# Patient Record
Sex: Female | Born: 1950 | Race: Black or African American | Hispanic: No | Marital: Married | Smoking: Never smoker
Health system: Southern US, Community
[De-identification: ages and names within clinical notes are randomized; demographics above are authoritative.]

## PROBLEM LIST (undated history)

## (undated) DIAGNOSIS — Z972 Presence of dental prosthetic device (complete) (partial): Secondary | ICD-10-CM

## (undated) DIAGNOSIS — M1990 Unspecified osteoarthritis, unspecified site: Secondary | ICD-10-CM

## (undated) DIAGNOSIS — R002 Palpitations: Secondary | ICD-10-CM

## (undated) DIAGNOSIS — E119 Type 2 diabetes mellitus without complications: Secondary | ICD-10-CM

## (undated) DIAGNOSIS — M5431 Sciatica, right side: Secondary | ICD-10-CM

## (undated) DIAGNOSIS — L603 Nail dystrophy: Secondary | ICD-10-CM

## (undated) DIAGNOSIS — I10 Essential (primary) hypertension: Secondary | ICD-10-CM

## (undated) DIAGNOSIS — M722 Plantar fascial fibromatosis: Secondary | ICD-10-CM

## (undated) DIAGNOSIS — I38 Endocarditis, valve unspecified: Secondary | ICD-10-CM

## (undated) HISTORY — PX: CHOLECYSTECTOMY: SHX55

## (undated) HISTORY — PX: CATARACT EXTRACTION W/ INTRAOCULAR LENS IMPLANT: SHX1309

---

## 2005-12-20 ENCOUNTER — Emergency Department: Payer: Self-pay | Admitting: Emergency Medicine

## 2006-03-21 ENCOUNTER — Emergency Department: Payer: Self-pay | Admitting: Emergency Medicine

## 2006-05-26 ENCOUNTER — Emergency Department: Payer: Self-pay | Admitting: Emergency Medicine

## 2006-07-19 ENCOUNTER — Other Ambulatory Visit: Payer: Self-pay

## 2006-07-19 ENCOUNTER — Emergency Department: Payer: Self-pay | Admitting: General Practice

## 2007-05-09 ENCOUNTER — Ambulatory Visit: Payer: Self-pay | Admitting: Endocrinology

## 2007-06-13 ENCOUNTER — Ambulatory Visit: Payer: Self-pay | Admitting: General Surgery

## 2007-06-27 ENCOUNTER — Ambulatory Visit: Payer: Self-pay | Admitting: General Surgery

## 2007-07-03 ENCOUNTER — Ambulatory Visit: Payer: Self-pay | Admitting: General Surgery

## 2007-07-23 ENCOUNTER — Ambulatory Visit: Payer: Self-pay | Admitting: General Surgery

## 2007-07-23 ENCOUNTER — Other Ambulatory Visit: Payer: Self-pay

## 2007-07-30 ENCOUNTER — Ambulatory Visit: Payer: Self-pay | Admitting: General Surgery

## 2008-05-07 ENCOUNTER — Emergency Department: Payer: Self-pay | Admitting: Emergency Medicine

## 2008-06-17 ENCOUNTER — Ambulatory Visit: Payer: Self-pay | Admitting: Endocrinology

## 2009-06-22 ENCOUNTER — Ambulatory Visit: Payer: Self-pay | Admitting: Endocrinology

## 2011-06-01 ENCOUNTER — Ambulatory Visit: Payer: Self-pay | Admitting: Internal Medicine

## 2011-09-04 ENCOUNTER — Ambulatory Visit: Payer: Self-pay | Admitting: Internal Medicine

## 2012-06-11 ENCOUNTER — Ambulatory Visit: Payer: Self-pay | Admitting: Internal Medicine

## 2012-07-25 ENCOUNTER — Ambulatory Visit: Payer: Self-pay | Admitting: Internal Medicine

## 2014-04-25 ENCOUNTER — Emergency Department: Payer: Self-pay | Admitting: Emergency Medicine

## 2014-07-30 ENCOUNTER — Other Ambulatory Visit: Payer: Self-pay | Admitting: Internal Medicine

## 2014-07-30 DIAGNOSIS — Z1231 Encounter for screening mammogram for malignant neoplasm of breast: Secondary | ICD-10-CM

## 2014-08-19 ENCOUNTER — Ambulatory Visit
Admission: RE | Admit: 2014-08-19 | Discharge: 2014-08-19 | Disposition: A | Discharge status: Home / Self Care | Payer: 59 | Source: Ambulatory Visit | Attending: Internal Medicine | Admitting: Internal Medicine

## 2014-08-19 DIAGNOSIS — R922 Inconclusive mammogram: Secondary | ICD-10-CM | POA: Diagnosis not present

## 2014-08-19 DIAGNOSIS — Z1231 Encounter for screening mammogram for malignant neoplasm of breast: Secondary | ICD-10-CM | POA: Diagnosis not present

## 2014-08-24 ENCOUNTER — Ambulatory Visit: Payer: Self-pay | Admitting: Physical Therapy

## 2014-12-17 ENCOUNTER — Emergency Department: Payer: Worker's Compensation

## 2014-12-17 ENCOUNTER — Emergency Department
Admission: EM | Admit: 2014-12-17 | Discharge: 2014-12-17 | Disposition: A | Discharge status: Home / Self Care | Payer: Worker's Compensation | Attending: Emergency Medicine | Admitting: Emergency Medicine

## 2014-12-17 DIAGNOSIS — Z880 Allergy status to penicillin: Secondary | ICD-10-CM | POA: Diagnosis not present

## 2014-12-17 DIAGNOSIS — S0121XA Laceration without foreign body of nose, initial encounter: Secondary | ICD-10-CM | POA: Diagnosis not present

## 2014-12-17 DIAGNOSIS — S0990XA Unspecified injury of head, initial encounter: Secondary | ICD-10-CM

## 2014-12-17 DIAGNOSIS — E119 Type 2 diabetes mellitus without complications: Secondary | ICD-10-CM | POA: Diagnosis not present

## 2014-12-17 DIAGNOSIS — W010XXA Fall on same level from slipping, tripping and stumbling without subsequent striking against object, initial encounter: Secondary | ICD-10-CM | POA: Insufficient documentation

## 2014-12-17 DIAGNOSIS — Y9289 Other specified places as the place of occurrence of the external cause: Secondary | ICD-10-CM | POA: Diagnosis not present

## 2014-12-17 DIAGNOSIS — I10 Essential (primary) hypertension: Secondary | ICD-10-CM | POA: Diagnosis not present

## 2014-12-17 DIAGNOSIS — Y9389 Activity, other specified: Secondary | ICD-10-CM | POA: Insufficient documentation

## 2014-12-17 DIAGNOSIS — IMO0002 Reserved for concepts with insufficient information to code with codable children: Secondary | ICD-10-CM

## 2014-12-17 DIAGNOSIS — S0181XA Laceration without foreign body of other part of head, initial encounter: Secondary | ICD-10-CM | POA: Insufficient documentation

## 2014-12-17 DIAGNOSIS — S139XXA Sprain of joints and ligaments of unspecified parts of neck, initial encounter: Secondary | ICD-10-CM

## 2014-12-17 DIAGNOSIS — Y990 Civilian activity done for income or pay: Secondary | ICD-10-CM | POA: Insufficient documentation

## 2014-12-17 DIAGNOSIS — S134XXA Sprain of ligaments of cervical spine, initial encounter: Secondary | ICD-10-CM | POA: Diagnosis not present

## 2014-12-17 HISTORY — DX: Essential (primary) hypertension: I10

## 2014-12-17 HISTORY — DX: Type 2 diabetes mellitus without complications: E11.9

## 2014-12-17 LAB — GLUCOSE, CAPILLARY: GLUCOSE-CAPILLARY: 136 mg/dL — AB (ref 65–99)

## 2014-12-17 MED ORDER — NAPROXEN 500 MG PO TABS
500.0000 mg | ORAL_TABLET | Freq: Once | ORAL | Status: AC
  Administered 2014-12-17: 500 mg via ORAL
  Filled 2014-12-17: qty 1

## 2014-12-17 MED ORDER — LIDOCAINE-EPINEPHRINE (PF) 1 %-1:200000 IJ SOLN
30.0000 mL | Freq: Once | INTRAMUSCULAR | Status: AC
  Administered 2014-12-17: 30 mL
  Filled 2014-12-17: qty 30

## 2014-12-17 MED ORDER — BACITRACIN ZINC 500 UNIT/GM EX OINT
TOPICAL_OINTMENT | CUTANEOUS | Status: DC
Start: 2014-12-17 — End: 2014-12-17
  Filled 2014-12-17: qty 0.9

## 2014-12-17 MED ORDER — BACITRACIN ZINC 500 UNIT/GM EX OINT
TOPICAL_OINTMENT | Freq: Two times a day (BID) | CUTANEOUS | Status: DC
  Administered 2014-12-17: 1 via TOPICAL

## 2014-12-17 MED ORDER — ONDANSETRON HCL 4 MG PO TABS: 4.0000 mg | ORAL_TABLET | Freq: Three times a day (TID) | ORAL | Status: AC | PRN

## 2014-12-17 MED ORDER — HYDROCODONE-ACETAMINOPHEN 5-325 MG PO TABS: 1.0000 | ORAL_TABLET | ORAL | Status: DC | PRN

## 2014-12-17 MED ORDER — NAPROXEN 500 MG PO TABS: 500.0000 mg | ORAL_TABLET | Freq: Two times a day (BID) | ORAL | Status: AC

## 2014-12-17 MED ORDER — HYDROCODONE-ACETAMINOPHEN 5-325 MG PO TABS
1.0000 | ORAL_TABLET | Freq: Once | ORAL | Status: AC
  Administered 2014-12-17: 1 via ORAL
  Filled 2014-12-17: qty 1

## 2014-12-17 MED ORDER — ONDANSETRON 4 MG PO TBDP
4.0000 mg | ORAL_TABLET | Freq: Once | ORAL | Status: AC
  Administered 2014-12-17: 4 mg via ORAL
  Filled 2014-12-17: qty 1

## 2014-12-17 NOTE — Discharge Instructions (Signed)
Cervical Strain and Sprain (Whiplash) °with Rehab °Cervical strain and sprain are injuries that commonly occur with "whiplash" injuries. Whiplash occurs when the neck is forcefully whipped backward or forward, such as during a motor vehicle accident or during contact sports. The muscles, ligaments, tendons, discs, and nerves of the neck are susceptible to injury when this occurs. °RISK FACTORS °Risk of having a whiplash injury increases if: °· Osteoarthritis of the spine. °· Situations that make head or neck accidents or trauma more likely. °· High-risk sports (football, rugby, wrestling, hockey, auto racing, gymnastics, diving, contact karate, or boxing). °· Poor strength and flexibility of the neck. °· Previous neck injury. °· Poor tackling technique. °· Improperly fitted or padded equipment. °SYMPTOMS  °· Pain or stiffness in the front or back of neck or both. °· Symptoms may present immediately or up to 24 hours after injury. °· Dizziness, headache, nausea, and vomiting. °· Muscle spasm with soreness and stiffness in the neck. °· Tenderness and swelling at the injury site. °PREVENTION °· Learn and use proper technique (avoid tackling with the head, spearing, and head-butting; use proper falling techniques to avoid landing on the head). °· Warm up and stretch properly before activity. °· Maintain physical fitness: °· Strength, flexibility, and endurance. °· Cardiovascular fitness. °· Wear properly fitted and padded protective equipment, such as padded soft collars, for participation in contact sports. °PROGNOSIS  °Recovery from cervical strain and sprain injuries is dependent on the extent of the injury. These injuries are usually curable in 1 week to 3 months with appropriate treatment.  °RELATED COMPLICATIONS  °· Temporary numbness and weakness may occur if the nerve roots are damaged, and this may persist until the nerve has completely healed. °· Chronic pain due to frequent recurrence of  symptoms. °· Prolonged healing, especially if activity is resumed too soon (before complete recovery). °TREATMENT  °Treatment initially involves the use of ice and medication to help reduce pain and inflammation. It is also important to perform strengthening and stretching exercises and modify activities that worsen symptoms so the injury does not get worse. These exercises may be performed at home or with a therapist. For patients who experience severe symptoms, a soft, padded collar may be recommended to be worn around the neck.  °Improving your posture may help reduce symptoms. Posture improvement includes pulling your chin and abdomen in while sitting or standing. If you are sitting, sit in a firm chair with your buttocks against the back of the chair. While sleeping, try replacing your pillow with a small towel rolled to 2 inches in diameter, or use a cervical pillow or soft cervical collar. Poor sleeping positions delay healing.  °For patients with nerve root damage, which causes numbness or weakness, the use of a cervical traction apparatus may be recommended. Surgery is rarely necessary for these injuries. However, cervical strain and sprains that are present at birth (congenital) may require surgery. °MEDICATION  °· If pain medication is necessary, nonsteroidal anti-inflammatory medications, such as aspirin and ibuprofen, or other minor pain relievers, such as acetaminophen, are often recommended. °· Do not take pain medication for 7 days before surgery. °· Prescription pain relievers may be given if deemed necessary by your caregiver. Use only as directed and only as much as you need. °HEAT AND COLD:  °· Cold treatment (icing) relieves pain and reduces inflammation. Cold treatment should be applied for 10 to 15 minutes every 2 to 3 hours for inflammation and pain and immediately after any activity that aggravates   your symptoms. Use ice packs or an ice massage. °· Heat treatment may be used prior to  performing the stretching and strengthening activities prescribed by your caregiver, physical therapist, or athletic trainer. Use a heat pack or a warm soak. °SEEK MEDICAL CARE IF:  °· Symptoms get worse or do not improve in 2 weeks despite treatment. °· New, unexplained symptoms develop (drugs used in treatment may produce side effects). °EXERCISES °RANGE OF MOTION (ROM) AND STRETCHING EXERCISES - Cervical Strain and Sprain °These exercises may help you when beginning to rehabilitate your injury. In order to successfully resolve your symptoms, you must improve your posture. These exercises are designed to help reduce the forward-head and rounded-shoulder posture which contributes to this condition. Your symptoms may resolve with or without further involvement from your physician, physical therapist or athletic trainer. While completing these exercises, remember:  °· Restoring tissue flexibility helps normal motion to return to the joints. This allows healthier, less painful movement and activity. °· An effective stretch should be held for at least 20 seconds, although you may need to begin with shorter hold times for comfort. °· A stretch should never be painful. You should only feel a gentle lengthening or release in the stretched tissue. °STRETCH- Axial Extensors °· Lie on your back on the floor. You may bend your knees for comfort. Place a rolled-up hand towel or dish towel, about 2 inches in diameter, under the part of your head that makes contact with the floor. °· Gently tuck your chin, as if trying to make a "double chin," until you feel a gentle stretch at the base of your head. °· Hold __________ seconds. °Repeat __________ times. Complete this exercise __________ times per day.  °STRETCH - Axial Extension  °· Stand or sit on a firm surface. Assume a good posture: chest up, shoulders drawn back, abdominal muscles slightly tense, knees unlocked (if standing) and feet hip width apart. °· Slowly retract your  chin so your head slides back and your chin slightly lowers. Continue to look straight ahead. °· You should feel a gentle stretch in the back of your head. Be certain not to feel an aggressive stretch since this can cause headaches later. °· Hold for __________ seconds. °Repeat __________ times. Complete this exercise __________ times per day. °STRETCH - Cervical Side Bend  °· Stand or sit on a firm surface. Assume a good posture: chest up, shoulders drawn back, abdominal muscles slightly tense, knees unlocked (if standing) and feet hip width apart. °· Without letting your nose or shoulders move, slowly tip your right / left ear to your shoulder until your feel a gentle stretch in the muscles on the opposite side of your neck. °· Hold __________ seconds. °Repeat __________ times. Complete this exercise __________ times per day. °STRETCH - Cervical Rotators  °· Stand or sit on a firm surface. Assume a good posture: chest up, shoulders drawn back, abdominal muscles slightly tense, knees unlocked (if standing) and feet hip width apart. °· Keeping your eyes level with the ground, slowly turn your head until you feel a gentle stretch along the back and opposite side of your neck. °· Hold __________ seconds. °Repeat __________ times. Complete this exercise __________ times per day. °RANGE OF MOTION - Neck Circles  °· Stand or sit on a firm surface. Assume a good posture: chest up, shoulders drawn back, abdominal muscles slightly tense, knees unlocked (if standing) and feet hip width apart. °· Gently roll your head down and around from the   back of one shoulder to the back of the other. The motion should never be forced or painful.  Repeat the motion 10-20 times, or until you feel the neck muscles relax and loosen. Repeat __________ times. Complete the exercise __________ times per day. STRENGTHENING EXERCISES - Cervical Strain and Sprain These exercises may help you when beginning to rehabilitate your injury. They may  resolve your symptoms with or without further involvement from your physician, physical therapist, or athletic trainer. While completing these exercises, remember:   Muscles can gain both the endurance and the strength needed for everyday activities through controlled exercises.  Complete these exercises as instructed by your physician, physical therapist, or athletic trainer. Progress the resistance and repetitions only as guided.  You may experience muscle soreness or fatigue, but the pain or discomfort you are trying to eliminate should never worsen during these exercises. If this pain does worsen, stop and make certain you are following the directions exactly. If the pain is still present after adjustments, discontinue the exercise until you can discuss the trouble with your clinician. STRENGTH - Cervical Flexors, Isometric  Face a wall, standing about 6 inches away. Place a small pillow, a ball about 6-8 inches in diameter, or a folded towel between your forehead and the wall.  Slightly tuck your chin and gently push your forehead into the soft object. Push only with mild to moderate intensity, building up tension gradually. Keep your jaw and forehead relaxed.  Hold 10 to 20 seconds. Keep your breathing relaxed.  Release the tension slowly. Relax your neck muscles completely before you start the next repetition. Repeat __________ times. Complete this exercise __________ times per day. STRENGTH- Cervical Lateral Flexors, Isometric   Stand about 6 inches away from a wall. Place a small pillow, a ball about 6-8 inches in diameter, or a folded towel between the side of your head and the wall.  Slightly tuck your chin and gently tilt your head into the soft object. Push only with mild to moderate intensity, building up tension gradually. Keep your jaw and forehead relaxed.  Hold 10 to 20 seconds. Keep your breathing relaxed.  Release the tension slowly. Relax your neck muscles completely  before you start the next repetition. Repeat __________ times. Complete this exercise __________ times per day. STRENGTH - Cervical Extensors, Isometric   Stand about 6 inches away from a wall. Place a small pillow, a ball about 6-8 inches in diameter, or a folded towel between the back of your head and the wall.  Slightly tuck your chin and gently tilt your head back into the soft object. Push only with mild to moderate intensity, building up tension gradually. Keep your jaw and forehead relaxed.  Hold 10 to 20 seconds. Keep your breathing relaxed.  Release the tension slowly. Relax your neck muscles completely before you start the next repetition. Repeat __________ times. Complete this exercise __________ times per day. POSTURE AND BODY MECHANICS CONSIDERATIONS - Cervical Strain and Sprain Keeping correct posture when sitting, standing or completing your activities will reduce the stress put on different body tissues, allowing injured tissues a chance to heal and limiting painful experiences. The following are general guidelines for improved posture. Your physician or physical therapist will provide you with any instructions specific to your needs. While reading these guidelines, remember:  The exercises prescribed by your provider will help you have the flexibility and strength to maintain correct postures.  The correct posture provides the optimal environment for your joints to  work. All of your joints have less wear and tear when properly supported by a spine with good posture. This means you will experience a healthier, less painful body.  Correct posture must be practiced with all of your activities, especially prolonged sitting and standing. Correct posture is as important when doing repetitive low-stress activities (typing) as it is when doing a single heavy-load activity (lifting). PROLONGED STANDING WHILE SLIGHTLY LEANING FORWARD When completing a task that requires you to lean  forward while standing in one place for a long time, place either foot up on a stationary 2- to 4-inch high object to help maintain the best posture. When both feet are on the ground, the low back tends to lose its slight inward curve. If this curve flattens (or becomes too large), then the back and your other joints will experience too much stress, fatigue more quickly, and can cause pain.  RESTING POSITIONS Consider which positions are most painful for you when choosing a resting position. If you have pain with flexion-based activities (sitting, bending, stooping, squatting), choose a position that allows you to rest in a less flexed posture. You would want to avoid curling into a fetal position on your side. If your pain worsens with extension-based activities (prolonged standing, working overhead), avoid resting in an extended position such as sleeping on your stomach. Most people will find more comfort when they rest with their spine in a more neutral position, neither too rounded nor too arched. Lying on a non-sagging bed on your side with a pillow between your knees, or on your back with a pillow under your knees will often provide some relief. Keep in mind, being in any one position for a prolonged period of time, no matter how correct your posture, can still lead to stiffness. WALKING Walk with an upright posture. Your ears, shoulders, and hips should all line up. OFFICE WORK When working at a desk, create an environment that supports good, upright posture. Without extra support, muscles fatigue and lead to excessive strain on joints and other tissues. CHAIR:  A chair should be able to slide under your desk when your back makes contact with the back of the chair. This allows you to work closely.  The chair's height should allow your eyes to be level with the upper part of your monitor and your hands to be slightly lower than your elbows.  Body position:  Your feet should make contact with the  floor. If this is not possible, use a foot rest.  Keep your ears over your shoulders. This will reduce stress on your neck and low back. Document Released: 03/06/2005 Document Revised: 07/21/2013 Document Reviewed: 06/18/2008 [facility] Patient Information 2015 [place], [place]. This information is not intended to replace advice given to you by your health care provider. Make sure you discuss any questions you have with your health care provider.  Concussion A concussion is a brain injury. It is caused by:  A hit to the head.  A quick and sudden movement (jolt) of the head or neck. A concussion is usually not life threatening. Even so, it can cause serious problems. If you had a concussion before, you may have concussion-like problems after a hit to your head. HOME CARE General Instructions  Follow your doctor's directions carefully.  Take medicines only as told by your doctor.  Only take medicines your doctor says are safe.  Do not drink alcohol until your doctor says it is okay. Alcohol and some drugs can slow down  healing. They can also put you at risk for further injury.  If you are having trouble remembering things, write them down.  Try to do one thing at a time if you get distracted easily. For example, do not watch TV while making dinner.  Talk to your family members or close friends when making important decisions.  Follow up with your doctor as told.  Watch your symptoms. Tell others to do the same. Serious problems can sometimes happen after a concussion. Older adults are more likely to have these problems.  Tell your teachers, school nurse, school counselor, coach, Event organiser, or work Production designer, theatre/television/film about your concussion. Tell them about what you can or cannot do. They should watch to see if:  It gets even harder for you to pay attention or concentrate.  It gets even harder for you to remember things or learn new things.  You need more time than normal to finish  things.  You become annoyed (irritable) more than before.  You are not able to deal with stress as well.  You have more problems than before.  Rest. Make sure you:  Get plenty of sleep at night.  Go to sleep early.  Go to bed at the same time every day. Try to wake up at the same time.  Rest during the day.  Take naps when you feel tired.  Limit activities where you have to think a lot or concentrate. These include:  Doing homework.  Doing work related to a job.  Watching TV.  Using the computer. Returning To Your Regular Activities Return to your normal activities slowly, not all at once. You must give your body and brain enough time to heal.   Do not play sports or do other athletic activities until your doctor says it is okay.  Ask your doctor when you can drive, ride a bicycle, or work other vehicles or machines. Never do these things if you feel dizzy.  Ask your doctor about when you can return to work or school. Preventing Another Concussion It is very important to avoid another brain injury, especially before you have healed. In rare cases, another injury can lead to permanent brain damage, brain swelling, or death. The risk of this is greatest during the first 7-10 days after your injury. Avoid injuries by:   Wearing a seat belt when riding in a car.  Not drinking too much alcohol.  Avoiding activities that could lead to a second concussion (such as contact sports).  Wearing a helmet when doing activities like:  Biking.  Skiing.  Skateboarding.  Skating.  Making your home safer by:  Removing things from the floor or stairways that could make you trip.  Using grab bars in bathrooms and handrails by stairs.  Placing non-slip mats on floors and in bathtubs.  Improve lighting in dark areas. GET HELP IF:  It gets even harder for you to pay attention or concentrate.  It gets even harder for you to remember things or learn new things.  You need  more time than normal to finish things.  You become annoyed (irritable) more than before.  You are not able to deal with stress as well.  You have more problems than before.  You have problems keeping your balance.  You are not able to react quickly when you should. Get help if you have any of these problems for more than 2 weeks:   Lasting (chronic) headaches.  Dizziness or trouble balancing.  Feeling sick to  your stomach (nausea).  Seeing (vision) problems.  Being affected by noises or light more than normal.  Feeling sad, low, down in the dumps, blue, gloomy, or empty (depressed).  Mood changes (mood swings).  Feeling of fear or nervousness about what may happen (anxiety).  Feeling annoyed.  Memory problems.  Problems concentrating or paying attention.  Sleep problems.  Feeling tired all the time. GET HELP RIGHT AWAY IF:   You have bad headaches or your headaches get worse.  You have weakness (even if it is in one hand, leg, or part of the face).  You have loss of feeling (numbness).  You feel off balance.  You keep throwing up (vomiting).  You feel tired.  One black center of your eye (pupil) is larger than the other.  You twitch or shake violently (convulse).  Your speech is not clear (slurred).  You are more confused, easily angered (agitated), or annoyed than before.  You have more trouble resting than before.  You are unable to recognize people or places.  You have neck pain.  It is difficult to wake you up.  You have unusual behavior changes.  You pass out (lose consciousness). MAKE SURE YOU:   Understand these instructions.  Will watch your condition.  Will get help right away if you are not doing well or get worse. Document Released: 02/22/2009 Document Revised: 07/21/2013 Document Reviewed: 09/26/2012 [facility] Patient Information 2015 [place], [place]. This information is not intended to replace advice given to you by your  health care provider. Make sure you discuss any questions you have with your health care provider.  Laceration Care, Adult A laceration is a cut that goes through all layers of the skin. The cut goes into the tissue beneath the skin. HOME CARE For stitches (sutures) or staples:  Keep the cut clean and dry.  If you have a bandage (dressing), change it at least once a day. Change the bandage if it gets wet or dirty, or as told by your doctor.  Wash the cut with soap and water 2 times a day. Rinse the cut with water. Pat it dry with a clean towel.  Put a thin layer of medicated cream on the cut as told by your doctor.  You may shower after the first 24 hours. Do not soak the cut in water until the stitches are removed.  Only take medicines as told by your doctor.  Have your stitches or staples removed as told by your doctor. For skin adhesive strips:  Keep the cut clean and dry.  Do not get the strips wet. You may take a bath, but be careful to keep the cut dry.  If the cut gets wet, pat it dry with a clean towel.  The strips will fall off on their own. Do not remove the strips that are still stuck to the cut. For wound glue:  You may shower or take baths. Do not soak or scrub the cut. Do not swim. Avoid heavy sweating until the glue falls off on its own. After a shower or bath, pat the cut dry with a clean towel.  Do not put medicine on your cut until the glue falls off.  If you have a bandage, do not put tape over the glue.  Avoid lots of sunlight or tanning lamps until the glue falls off. Put sunscreen on the cut for the first year to reduce your scar.  The glue will fall off on its own. Do not pick  at the glue. You may need a tetanus shot if:  You cannot remember when you had your last tetanus shot.  You have never had a tetanus shot. If you need a tetanus shot and you choose not to have one, you may get tetanus. Sickness from tetanus can be serious. GET HELP RIGHT AWAY  IF:   Your pain does not get better with medicine.  Your arm, hand, leg, or foot loses feeling (numbness) or changes color.  Your cut is bleeding.  Your joint feels weak, or you cannot use your joint.  You have painful lumps on your body.  Your cut is red, puffy (swollen), or painful.  You have a red line on the skin near the cut.  You have yellowish-white fluid (pus) coming from the cut.  You have a fever.  You have a bad smell coming from the cut or bandage.  Your cut breaks open before or after stitches are removed.  You notice something coming out of the cut, such as wood or glass.  You cannot move a finger or toe. MAKE SURE YOU:   Understand these instructions.  Will watch your condition.  Will get help right away if you are not doing well or get worse. Document Released: 08/23/2007 Document Revised: 05/29/2011 Document Reviewed: 08/30/2010 [facility] Patient Information 2015 [place], [place]. This information is not intended to replace advice given to you by your health care provider. Make sure you discuss any questions you have with your health care provider.   Pain medicine as directed. Watch for signs of infection. Follow up in 5-7 days for suture removal. Return sooner for any concerns.

## 2014-12-17 NOTE — ED Notes (Signed)
Brought in via co-worker  Fell laceration noted to forehead,left wrist and both knees

## 2014-12-17 NOTE — ED Notes (Signed)
Urine drug screen was performed by Paramedic Sydnee Cabal

## 2014-12-17 NOTE — ED Provider Notes (Signed)
[facility] Emergency Department Provider Note  ____________________________________________  Time seen: Approximately 4:05 PM  I have reviewed the triage vital signs and the nursing notes.   HISTORY  Chief Complaint Fall    HPI Brenda Mullen is a 64 y.o. female with history of hypertension and diabetes who tripped while at work today, falling forward and hitting her forehead.   Her glassescaused a laceration to the forehead and bridge of the nose.  Has dizziness with movement as well as nausea.   Also having neck pain with movement, and left  Wrist pain.  She fell onto her knees as well and is having bilateral knee pain.   Past Medical History  Diagnosis Date  . Diabetes mellitus without complication   . Hypertension     There are no active problems to display for this patient.   History reviewed. No pertinent past surgical history.  Current Outpatient Rx  Name  Route  Sig  Dispense  Refill  . HYDROcodone-acetaminophen (NORCO) 5-325 MG tablet   Oral   Take 1 tablet by mouth every 4 (four) hours as needed for moderate pain.   20 tablet   0   . naproxen (NAPROSYN) 500 MG tablet   Oral   Take 1 tablet (500 mg total) by mouth 2 (two) times daily with a meal.   40 tablet   0   . ondansetron (ZOFRAN) 4 MG tablet   Oral   Take 1 tablet (4 mg total) by mouth every 8 (eight) hours as needed for nausea or vomiting.   12 tablet   0     Allergies Metformin and related and Penicillins  Family History  Problem Relation Age of Onset  . Cancer Sister     uterine    Social History Social History  Substance Use Topics  . Smoking status: Never Smoker   . Smokeless tobacco: None  . Alcohol Use: No    Review of Systems Constitutional: No fever/chills Eyes: No visual changes. ENT: No sore throat. Cardiovascular: Denies chest pain. Respiratory: Denies shortness of breath. Gastrointestinal: No abdominal pain.  Nausea, but no vomiting.  No  diarrhea.  No constipation. Genitourinary: Negative for dysuria. Musculoskeletal: as above Skin: Negative for rash. Neurological: as above  10-point ROS otherwise negative.  ____________________________________________   PHYSICAL EXAM:  VITAL SIGNS: ED Triage Vitals  Enc Vitals Group     BP --      Pulse --      Resp --      Temp --      Temp src --      SpO2 --      Weight 12/17/14 1406 182 lb (82.555 kg)     Height 12/17/14 1406  (1.6 m)     Head Cir --      Peak Flow --      Pain Score 12/17/14 1513 8     Pain Loc --      Pain Edu? --      Excl. in GC? --     Constitutional: Alert and oriented. Well appearing and in no acute distress. Eyes: Conjunctivae are normal. PERRL. EOMI. Head: Atraumatic. Nose: No congestion/rhinnorhea. Neuro:  cranial nerves II through XII grossly intact. Normal cerebellar function. finger to nose testing. Mouth/Throat: Mucous membranes are moist.  Oropharynx non-erythematous. Neck: No stridor.   cervical spine tenderness to palpation. Hematological/Lymphatic/Immunilogical: No cervical lymphadenopathy. Cardiovascular: Normal rate, regular rhythm. Grossly normal heart sounds.  Good peripheral circulation.  Respiratory: Normal respiratory effort.  No retractions. Lungs CTAB. Gastrointestinal: Soft and nontender. No distention. No abdominal bruits. No CVA tenderness. Musculoskeletal: No lower extremity tenderness nor edema.  No joint effusions. Left knee: Over the suprapateller region.  No Effusion. range of motion intact, no laxity with valgus or varus stress or lachmans test. Left wrist;  No snuff box tenderness.  rom intact.  Tender over the volar wrist.  2+ radial pulse Neurologic:  Normal speech and language. No gross focal neurologic deficits are appreciated. No gait instability. Skin:  Skin is warm, dry and intact. No rash noted. Psychiatric: Mood and affect are normal. Speech and behavior are  normal.  ____________________________________________   LABS (all labs ordered are listed, but only abnormal results are displayed)  Labs Reviewed  GLUCOSE, CAPILLARY - Abnormal; Notable for the following:    Glucose-Capillary 136 (*)    All other components within normal limits   ____________________________________________  EKG   ____________________________________________  RADIOLOGY  CLINICAL DATA: Fall. Wrist pain.  EXAM: LEFT WRIST - COMPLETE 3+ VIEW  COMPARISON: None.  FINDINGS: Normal alignment. Negative for fracture. No significant arthropathy.  IMPRESSION: Negative.   Electronically Signed  By: Marlan Palau M.D.  On: 12/17/2014 16:10   FINDINGS: CT HEAD FINDINGS  Ventricle size normal. Cerebral volume normal. Negative for acute infarct. Negative for hemorrhage or mass  Scalp laceration anteriorly with gas in the soft tissues. Negative for skull fracture.  CT CERVICAL SPINE FINDINGS  Cervical kyphosis. Normal alignment. Disc degeneration and spurring C4-5, C5-6, C6-7. Mild facet degeneration on the left at these levels.  Negative for fracture.  IMPRESSION: No acute intracranial abnormality. Frontal laceration.  Cervical kyphosis and degenerative change. Negative for fracture.   Electronically Signed  By: Marlan Palau M.D.  On: 12/17/2014 16:04 ____________________________________________   PROCEDURES  Procedure(s) performed: LACERATION REPAIR Performed by: Ignacia Bayley Authorized by: Ignacia Bayley Consent: Verbal consent obtained. Risks and benefits: risks, benefits and alternatives were discussed Consent given by: patient Patient identity confirmed: provided demographic data Prepped and Draped in normal sterile fashion Wound explored  Laceration Location: nose, bridge  Laceration Length: 1cm  No Foreign Bodies seen or palpated  Anesthesia: local infiltration  Local anesthetic: lidocaine 1%  with epinephrine  Anesthetic total: 3 ml  Irrigation method: syringe Amount of cleaning: standard  Skin closure: 5-0 nylon  Number of sutures: 3  Technique: simple interrupted  Patient tolerance: Patient tolerated the procedure well with no immediate complications.    LACERATION REPAIR Performed by: Ignacia Bayley Authorized by: Ignacia Bayley Consent: Verbal consent obtained. Risks and benefits: risks, benefits and alternatives were discussed Consent given by: patient Patient identity confirmed: provided demographic data Prepped and Draped in normal sterile fashion Wound explored  Laceration Location: forehead  Laceration Length: 1.5cm  No Foreign Bodies seen or palpated  Anesthesia: local infiltration  Local anesthetic: none  Anesthetic total: 0 ml  Irrigation method: syringe Amount of cleaning: standard  Skin closure: adhesive  Number of sutures: na  Technique: skin adhesive  Patient tolerance: Patient tolerated the procedure well with no immediate complications.   Critical Care performed: No  ____________________________________________   INITIAL IMPRESSION / ASSESSMENT AND PLAN / ED COURSE  Pertinent labs & imaging results that were available during my care of the patient were reviewed by me and considered in my medical decision making (see chart for details).  64 year old female who presents with head injury from a fall.  Suffered  Two lacerations, repaired as above.   Neg head  CT/Cervical spine.  Instructed in wound care and returning in 5-7 days for suture removal.  ____________________________________________   FINAL CLINICAL IMPRESSION(S) / ED DIAGNOSES  Final diagnoses:  Head injury, initial encounter  Laceration  Cervical sprain, initial encounter      Ignacia Bayley, PA-C 12/17/14 1855  Phineas Semen, MD 12/17/14 2103

## 2014-12-17 NOTE — ED Notes (Signed)
Patient comes in for fall at work at [employer]. Patient tripped over box, fell and hit forehead and nose sustaining two lacerations.  One laceration to forehead and one to nose.  Nose laceration bleeding, guaze applied.  Pt reports wearing glasses and that is what she feels called laceration.  Patient denies loss of consciousness but is complaining of dizziness.

## 2015-09-30 ENCOUNTER — Other Ambulatory Visit: Payer: Self-pay | Admitting: Internal Medicine

## 2015-09-30 DIAGNOSIS — Z1231 Encounter for screening mammogram for malignant neoplasm of breast: Secondary | ICD-10-CM

## 2015-11-04 ENCOUNTER — Other Ambulatory Visit: Payer: Self-pay | Admitting: Internal Medicine

## 2015-11-04 ENCOUNTER — Ambulatory Visit
Admission: RE | Admit: 2015-11-04 | Discharge: 2015-11-04 | Disposition: A | Discharge status: Home / Self Care | Payer: 59 | Source: Ambulatory Visit | Attending: Internal Medicine | Admitting: Internal Medicine

## 2015-11-04 DIAGNOSIS — Z1231 Encounter for screening mammogram for malignant neoplasm of breast: Secondary | ICD-10-CM

## 2016-08-31 ENCOUNTER — Other Ambulatory Visit: Payer: Self-pay | Admitting: Internal Medicine

## 2016-08-31 DIAGNOSIS — Z1231 Encounter for screening mammogram for malignant neoplasm of breast: Secondary | ICD-10-CM

## 2016-11-06 ENCOUNTER — Ambulatory Visit
Admission: RE | Admit: 2016-11-06 | Discharge: 2016-11-06 | Disposition: A | Discharge status: Home / Self Care | Payer: 59 | Source: Ambulatory Visit | Attending: Internal Medicine | Admitting: Internal Medicine

## 2016-11-06 DIAGNOSIS — R928 Other abnormal and inconclusive findings on diagnostic imaging of breast: Secondary | ICD-10-CM | POA: Diagnosis not present

## 2016-11-06 DIAGNOSIS — Z1231 Encounter for screening mammogram for malignant neoplasm of breast: Secondary | ICD-10-CM

## 2016-11-09 ENCOUNTER — Other Ambulatory Visit: Payer: Self-pay | Admitting: Internal Medicine

## 2016-11-09 DIAGNOSIS — R928 Other abnormal and inconclusive findings on diagnostic imaging of breast: Secondary | ICD-10-CM

## 2016-11-09 DIAGNOSIS — N6489 Other specified disorders of breast: Secondary | ICD-10-CM

## 2016-11-23 ENCOUNTER — Ambulatory Visit
Admission: RE | Admit: 2016-11-23 | Discharge: 2016-11-23 | Disposition: A | Discharge status: Home / Self Care | Payer: 59 | Source: Ambulatory Visit | Attending: Internal Medicine | Admitting: Internal Medicine

## 2016-11-23 DIAGNOSIS — N6489 Other specified disorders of breast: Secondary | ICD-10-CM

## 2016-11-23 DIAGNOSIS — R928 Other abnormal and inconclusive findings on diagnostic imaging of breast: Secondary | ICD-10-CM

## 2017-01-11 ENCOUNTER — Other Ambulatory Visit: Payer: Self-pay | Admitting: Internal Medicine

## 2017-01-11 DIAGNOSIS — R748 Abnormal levels of other serum enzymes: Secondary | ICD-10-CM

## 2017-01-19 ENCOUNTER — Ambulatory Visit
Admission: RE | Admit: 2017-01-19 | Discharge: 2017-01-19 | Disposition: A | Discharge status: Home / Self Care | Payer: 59 | Source: Ambulatory Visit | Attending: Internal Medicine | Admitting: Internal Medicine

## 2017-01-19 DIAGNOSIS — K760 Fatty (change of) liver, not elsewhere classified: Secondary | ICD-10-CM | POA: Diagnosis not present

## 2017-01-19 DIAGNOSIS — Z9049 Acquired absence of other specified parts of digestive tract: Secondary | ICD-10-CM | POA: Insufficient documentation

## 2017-01-19 DIAGNOSIS — R748 Abnormal levels of other serum enzymes: Secondary | ICD-10-CM | POA: Diagnosis not present

## 2017-08-17 ENCOUNTER — Other Ambulatory Visit: Payer: Self-pay | Admitting: Student

## 2017-08-17 DIAGNOSIS — M4726 Other spondylosis with radiculopathy, lumbar region: Secondary | ICD-10-CM

## 2017-08-22 ENCOUNTER — Ambulatory Visit
Admission: RE | Admit: 2017-08-22 | Discharge: 2017-08-22 | Disposition: A | Discharge status: Home / Self Care | Payer: Medicare Other | Source: Ambulatory Visit | Attending: Student | Admitting: Student

## 2017-08-22 DIAGNOSIS — M4726 Other spondylosis with radiculopathy, lumbar region: Secondary | ICD-10-CM | POA: Diagnosis present

## 2017-08-22 DIAGNOSIS — M5136 Other intervertebral disc degeneration, lumbar region: Secondary | ICD-10-CM | POA: Insufficient documentation

## 2017-08-22 DIAGNOSIS — M48061 Spinal stenosis, lumbar region without neurogenic claudication: Secondary | ICD-10-CM | POA: Diagnosis not present

## 2017-08-27 ENCOUNTER — Other Ambulatory Visit: Payer: Self-pay | Admitting: Student

## 2017-08-27 DIAGNOSIS — M4726 Other spondylosis with radiculopathy, lumbar region: Secondary | ICD-10-CM

## 2017-09-06 ENCOUNTER — Ambulatory Visit: Admission: RE | Admit: 2017-09-06 | Payer: Medicare Other | Source: Ambulatory Visit

## 2017-09-06 ENCOUNTER — Ambulatory Visit
Admission: RE | Admit: 2017-09-06 | Discharge: 2017-09-06 | Disposition: A | Discharge status: Home / Self Care | Payer: Medicare Other | Source: Ambulatory Visit | Attending: Student | Admitting: Student

## 2017-09-06 ENCOUNTER — Encounter: Payer: Self-pay | Admitting: Interventional Radiology

## 2017-09-06 DIAGNOSIS — M47817 Spondylosis without myelopathy or radiculopathy, lumbosacral region: Secondary | ICD-10-CM | POA: Diagnosis not present

## 2017-09-06 DIAGNOSIS — M4726 Other spondylosis with radiculopathy, lumbar region: Secondary | ICD-10-CM

## 2017-09-06 HISTORY — PX: IR FLUORO GUIDED NEEDLE PLC ASPIRATION/INJECTION LOC: IMG2395

## 2017-09-06 LAB — GLUCOSE, CAPILLARY: Glucose-Capillary: 134 mg/dL — ABNORMAL HIGH (ref 65–99)

## 2017-09-06 MED ORDER — LIDOCAINE HCL (PF) 1 % IJ SOLN
INTRAMUSCULAR | Status: AC
  Filled 2017-09-06: qty 30

## 2017-09-06 MED ORDER — HYDROCODONE-ACETAMINOPHEN 5-325 MG PO TABS: 1.0000 | ORAL_TABLET | ORAL | Status: DC | PRN

## 2017-09-06 MED ORDER — METHYLPREDNISOLONE ACETATE 80 MG/ML IJ SUSP
INTRAMUSCULAR | Status: AC
Start: 2017-09-06 — End: ?
  Filled 2017-09-06: qty 2

## 2017-09-06 MED ORDER — ONDANSETRON HCL 4 MG/2ML IJ SOLN: 4.0000 mg | Freq: Four times a day (QID) | INTRAMUSCULAR | Status: DC | PRN

## 2017-09-06 MED ORDER — IOPAMIDOL (ISOVUE-300) INJECTION 61%: 30.0000 mL | Freq: Once | INTRAVENOUS | Status: DC | PRN

## 2017-09-06 NOTE — Procedures (Signed)
  Procedure: R L5-S1 facet injection   EBL:   minimal Complications:  none immediate  See full dictation in [facility] PACS.  Thora Lance. Meggen Spaziani MD Main # [phone] Pager  [phone]

## 2017-09-06 NOTE — Progress Notes (Signed)
Pt. C/o 5:10 HA post-procedure.  States she thinks she is hungry.  Ate boxed lunch and drank a soda-pain relieved.

## 2017-09-20 ENCOUNTER — Emergency Department: Payer: Medicare Other

## 2017-09-20 ENCOUNTER — Emergency Department
Admission: EM | Admit: 2017-09-20 | Discharge: 2017-09-20 | Disposition: A | Discharge status: Home / Self Care | Payer: Medicare Other | Attending: Student in an Organized Health Care Education/Training Program | Admitting: Student in an Organized Health Care Education/Training Program

## 2017-09-20 ENCOUNTER — Encounter: Payer: Self-pay | Admitting: Emergency Medicine

## 2017-09-20 DIAGNOSIS — E119 Type 2 diabetes mellitus without complications: Secondary | ICD-10-CM | POA: Insufficient documentation

## 2017-09-20 DIAGNOSIS — R002 Palpitations: Secondary | ICD-10-CM | POA: Insufficient documentation

## 2017-09-20 DIAGNOSIS — R42 Dizziness and giddiness: Secondary | ICD-10-CM

## 2017-09-20 DIAGNOSIS — Z79899 Other long term (current) drug therapy: Secondary | ICD-10-CM | POA: Insufficient documentation

## 2017-09-20 LAB — URINALYSIS, COMPLETE (UACMP) WITH MICROSCOPIC
BACTERIA UA: NONE SEEN
Bilirubin Urine: NEGATIVE
Glucose, UA: >=500 mg/dL — AB
HGB URINE DIPSTICK: NEGATIVE
Ketones, ur: NEGATIVE mg/dL
Leukocytes, UA: NEGATIVE
NITRITE: NEGATIVE
Protein, ur: NEGATIVE mg/dL
SPECIFIC GRAVITY, URINE: 1.022 (ref 1.005–1.030)
pH: 5 (ref 5.0–8.0)

## 2017-09-20 LAB — BASIC METABOLIC PANEL
ANION GAP: 9 (ref 5–15)
BUN: 20 mg/dL (ref 8–23)
CALCIUM: 9.5 mg/dL (ref 8.9–10.3)
CO2: 25 mmol/L (ref 22–32)
Chloride: 102 mmol/L (ref 98–111)
Creatinine, Ser: 0.64 mg/dL (ref 0.44–1.00)
GFR calc Af Amer: >60 mL/min (ref >60)
GLUCOSE: 298 mg/dL — AB (ref 70–99)
Potassium: 3.7 mmol/L (ref 3.5–5.1)
Sodium: 136 mmol/L (ref 135–145)

## 2017-09-20 LAB — CBC
HEMATOCRIT: 42.9 % (ref 35.0–47.0)
Hemoglobin: 14.7 g/dL (ref 12.0–16.0)
MCH: 33.7 pg (ref 26.0–34.0)
MCHC: 34.4 g/dL (ref 32.0–36.0)
MCV: 98 fL (ref 80.0–100.0)
Platelets: 232 10*3/uL (ref 150–440)
RBC: 4.37 MIL/uL (ref 3.80–5.20)
RDW: 13.9 % (ref 11.5–14.5)
WBC: 9.9 10*3/uL (ref 3.6–11.0)

## 2017-09-20 LAB — TROPONIN I

## 2017-09-20 MED ORDER — SODIUM CHLORIDE 0.9 % IV BOLUS
1000.0000 mL | Freq: Once | INTRAVENOUS | Status: AC
  Administered 2017-09-20: 1000 mL via INTRAVENOUS

## 2017-09-20 NOTE — ED Provider Notes (Signed)
[facility] Emergency Department Provider Note    First MD Initiated Contact with Patient 09/20/17 1534     (approximate)  I have reviewed the triage vital signs and the nursing notes.   HISTORY  Chief Complaint Dizziness    HPI Brenda Mullen is a 67 y.o. female a history of diabetes presents to the ER with several days of brief episodes of heart palpitations associated with shortness of breath and lightheadedness.  States that these episodes last only 1 or 2 minutes.  No associated diaphoresis nausea or vomiting.  Denies any chest pain.  Denies any history of heart attack.  States she does have a history of anxiety and has been feeling very stressed as her sister just recently had a heart attack and was diagnosed with cancer.  Patient denies any numbness or tingling.  No recent falls.  Did recently get a spinal injection with steroids and has been having trouble controlling her blood sugar.    Past Medical History:  Diagnosis Date  . Diabetes mellitus without complication (HCC)   . Hypertension    Family History  Problem Relation Age of Onset  . Cancer Sister        uterine   Past Surgical History:  Procedure Laterality Date  . IR FLUORO GUIDED NEEDLE PLC ASPIRATION/INJECTION LOC  09/06/2017   There are no active problems to display for this patient.     Prior to Admission medications   Medication Sig Start Date End Date Taking? Authorizing Provider  amLODipine-benazepril (LOTREL) 5-10 MG capsule Take 1 capsule by mouth daily.    [provider]  atenolol (TENORMIN) 25 MG tablet Take 25 mg by mouth daily.    [provider]  fluticasone (FLONASE) 50 MCG/ACT nasal spray Place 2 sprays into both nostrils daily.    [provider]  glipiZIDE (GLUCOTROL) 5 MG tablet Take 5 mg by mouth daily before breakfast.    [provider]  hydrochlorothiazide (HYDRODIURIL) 12.5 MG tablet Take 12.5 mg by mouth daily.     [provider]  HYDROcodone-acetaminophen (NORCO) 5-325 MG tablet Take 1 tablet by mouth every 4 (four) hours as needed for moderate pain. 12/17/14   Ignacia Bayley, PA-C  pioglitazone (ACTOS) 15 MG tablet Take 15 mg by mouth daily.    [provider]    Allergies Metformin and related and Penicillins    Social History Social History   Tobacco Use  . Smoking status: Never Smoker  . Smokeless tobacco: Never Used  Substance Use Topics  . Alcohol use: No  . Drug use: No    Review of Systems Patient denies headaches, rhinorrhea, blurry vision, numbness, shortness of breath, chest pain, edema, cough, abdominal pain, nausea, vomiting, diarrhea, dysuria, fevers, rashes or hallucinations unless otherwise stated above in HPI. ____________________________________________   PHYSICAL EXAM:  VITAL SIGNS: Vitals:   09/20/17 1630 09/20/17 1700  BP: 134/66 140/79  Pulse: (!) 52 (!) 50  Resp: 20 19  Temp:    SpO2: 98% 98%    Constitutional: Alert and oriented.  Eyes: Conjunctivae are normal.  Head: Atraumatic. Nose: No congestion/rhinnorhea. Mouth/Throat: Mucous membranes are moist.   Neck: No stridor. Painless ROM.  Cardiovascular: Normal rate, regular rhythm. Grossly normal heart sounds.  Good peripheral circulation. Respiratory: Normal respiratory effort.  No retractions. Lungs CTAB. Gastrointestinal: Soft and nontender. No distention. No abdominal bruits. No CVA tenderness. Genitourinary:  Musculoskeletal: No lower extremity tenderness nor edema.  No joint effusions. Neurologic:  Normal speech and language. No gross focal neurologic deficits are appreciated. No facial droop Skin:  Skin is warm, dry and intact. No rash noted. Psychiatric: Mood and affect are normal. Speech and behavior are normal.  ____________________________________________   LABS (all labs ordered are listed, but only abnormal results are displayed)  Results for orders placed or  performed during the hospital encounter of 09/20/17 (from the past 24 hour(s))  Basic metabolic panel     Status: Abnormal   Collection Time: 09/20/17  2:09 PM  Result Value Ref Range   Sodium 136 135 - 145 mmol/L   Potassium 3.7 3.5 - 5.1 mmol/L   Chloride 102 98 - 111 mmol/L   CO2 25 22 - 32 mmol/L   Glucose, Bld 298 (H) 70 - 99 mg/dL   BUN 20 8 - 23 mg/dL   Creatinine, Ser 3.24 0.44 - 1.00 mg/dL   Calcium 9.5 8.9 - 40.1 mg/dL   GFR calc non Af Amer >60 >60 mL/min   GFR calc Af Amer >60 >60 mL/min   Anion gap 9 5 - 15  CBC     Status: None   Collection Time: 09/20/17  2:09 PM  Result Value Ref Range   WBC 9.9 3.6 - 11.0 K/uL   RBC 4.37 3.80 - 5.20 MIL/uL   Hemoglobin 14.7 12.0 - 16.0 g/dL   HCT 02.7 25.3 - 66.4 %   MCV 98.0 80.0 - 100.0 fL   MCH 33.7 26.0 - 34.0 pg   MCHC 34.4 32.0 - 36.0 g/dL   RDW 40.3 47.4 - 25.9 %   Platelets 232 150 - 440 K/uL  Urinalysis, Complete w Microscopic     Status: Abnormal   Collection Time: 09/20/17  2:09 PM  Result Value Ref Range   Color, Urine STRAW (A) YELLOW   APPearance CLEAR (A) CLEAR   Specific Gravity, Urine 1.022 1.005 - 1.030   pH 5.0 5.0 - 8.0   Glucose, UA >=500 (A) NEGATIVE mg/dL   Hgb urine dipstick NEGATIVE NEGATIVE   Bilirubin Urine NEGATIVE NEGATIVE   Ketones, ur NEGATIVE NEGATIVE mg/dL   Protein, ur NEGATIVE NEGATIVE mg/dL   Nitrite NEGATIVE NEGATIVE   Leukocytes, UA NEGATIVE NEGATIVE   RBC / HPF 0-5 0 - 5 RBC/hpf   WBC, UA 0-5 0 - 5 WBC/hpf   Bacteria, UA NONE SEEN NONE SEEN   Squamous Epithelial / LPF 0-5 0 - 5   Mucus PRESENT   Troponin I     Status: None   Collection Time: 09/20/17  2:09 PM  Result Value Ref Range   Troponin I <0.03 <0.03 ng/mL  Troponin I     Status: None   Collection Time: 09/20/17  4:43 PM  Result Value Ref Range   Troponin I <0.03 <0.03 ng/mL   ____________________________________________  EKG My review and personal interpretation at Time: 14:10   Indication: dizziness  Rate: 70   Rhythm: sinus Axis: normal Other: normal intervals, nonsepcific t wave abn, no stemi ____________________________________________  RADIOLOGY  I personally reviewed all radiographic images ordered to evaluate for the above acute complaints and reviewed radiology reports and findings.  These findings were personally discussed with the patient.  Please see medical record for radiology report.  ____________________________________________   PROCEDURES  Procedure(s) performed:  Procedures    Critical Care performed: no ____________________________________________   INITIAL IMPRESSION / ASSESSMENT AND PLAN / ED COURSE  Pertinent labs & imaging results that were available during my care of the patient  were reviewed by me and considered in my medical decision making (see chart for details).   DDX: dehydration, hyperglycemia, dysrhythmia, anemia, acs, chf  Klani H Fenton Mallinghorpe is a 67 y.o. who presents to the ED with symptoms as described above.  Patient nontoxic-appearing hemodynamically stable with reassuring vital signs.  No focal neuro deficits.  A symptom medic at this time.  Did have some chest discomfort prior to arrival but very atypical for ACS given frequent but very brief duration of episodes.  Possible dysrhythmia therefore patient will be kept on monitor in the ER while we further stratify with repeat troponins.  Clinical Course as of Sep 20 1816  Thu Sep 20, 2017  1811 Repeat troponin negative.  Patient well-appearing.  Remains hemodynamically stable.  No discomfort at this time.  No dysrhythmia on cardiac monitoring.  At this point do believe patient stable and appropriate for outpatient follow-up.   [PR]    Clinical Course User Index [PR] Willy Eddyobinson, Gilles Trimpe, MD     As part of my medical decision making, I reviewed the following data within the electronic MEDICAL RECORD NUMBER Nursing notes reviewed and incorporated, Labs reviewed, notes from prior ED  visits.   ____________________________________________   FINAL CLINICAL IMPRESSION(S) / ED DIAGNOSES  Final diagnoses:  Dizziness  Palpitations      NEW MEDICATIONS STARTED DURING THIS VISIT:  New Prescriptions   No medications on file     Note:  This document was prepared using Dragon voice recognition software and may include unintentional dictation errors.    Willy Eddyobinson, Evalena Fujii, MD 09/20/17 [phone]

## 2017-09-20 NOTE — ED Triage Notes (Signed)
Pt comes into the ED via POV c/o lightheadedness that has been intermittent for a couple days.  Patient also states she gets short of breath right before it happens.  Denies any chest pain, N/V/D.  Patient is neurologically intact at this time and in NAD.

## 2018-02-18 ENCOUNTER — Other Ambulatory Visit: Payer: Self-pay | Admitting: Obstetrics and Gynecology

## 2018-02-18 DIAGNOSIS — Z1231 Encounter for screening mammogram for malignant neoplasm of breast: Secondary | ICD-10-CM

## 2018-03-18 ENCOUNTER — Ambulatory Visit
Admission: RE | Admit: 2018-03-18 | Discharge: 2018-03-18 | Disposition: A | Discharge status: Home / Self Care | Payer: Medicare Other | Source: Ambulatory Visit | Attending: Obstetrics and Gynecology | Admitting: Obstetrics and Gynecology

## 2018-03-18 DIAGNOSIS — Z1231 Encounter for screening mammogram for malignant neoplasm of breast: Secondary | ICD-10-CM | POA: Insufficient documentation

## 2018-03-28 ENCOUNTER — Ambulatory Visit: Payer: Medicare Other | Attending: Obstetrics and Gynecology | Admitting: Physical Therapy

## 2018-04-04 ENCOUNTER — Encounter: Payer: Medicare Other | Admitting: Physical Therapy

## 2018-04-11 ENCOUNTER — Encounter: Payer: Medicare Other | Admitting: Physical Therapy

## 2018-04-18 ENCOUNTER — Encounter: Payer: Medicare Other | Admitting: Physical Therapy

## 2018-04-25 ENCOUNTER — Encounter: Payer: Self-pay | Admitting: Physical Therapy

## 2018-05-02 ENCOUNTER — Encounter: Payer: Self-pay | Admitting: Physical Therapy

## 2018-05-09 ENCOUNTER — Encounter: Payer: Self-pay | Admitting: Physical Therapy

## 2018-05-16 ENCOUNTER — Encounter: Payer: Self-pay | Admitting: Physical Therapy

## 2018-05-21 ENCOUNTER — Ambulatory Visit: Admission: RE | Admit: 2018-05-21 | Payer: Medicare Other | Source: Home / Self Care | Admitting: Internal Medicine

## 2018-05-21 ENCOUNTER — Encounter: Admission: RE | Payer: Self-pay | Source: Home / Self Care

## 2018-05-21 SURGERY — ESOPHAGOGASTRODUODENOSCOPY (EGD) WITH PROPOFOL
Anesthesia: General

## 2018-05-23 ENCOUNTER — Encounter: Payer: Self-pay | Admitting: Physical Therapy

## 2018-05-30 ENCOUNTER — Encounter: Payer: Self-pay | Admitting: Physical Therapy

## 2018-08-19 IMAGING — MG MM DIGITAL DIAGNOSTIC UNILAT*L* W/ TOMO W/ CAD
6 of 9 series · 6 of 21 positions shown · non-contrast
Comparison: Previous exam(s).

CLINICAL DATA: Callback from screening mammogram for possible left
breast asymmetry. The patient reports a history of multiple small
skin abscess all over her body including within her axilla. She
currently has one of these areas in her left axilla.

EXAM:
2D DIGITAL DIAGNOSTIC LEFT MAMMOGRAM WITH CAD AND ADJUNCT TOMO
ULTRASOUND LEFT BREAST

[L MLO (1 of 2)]
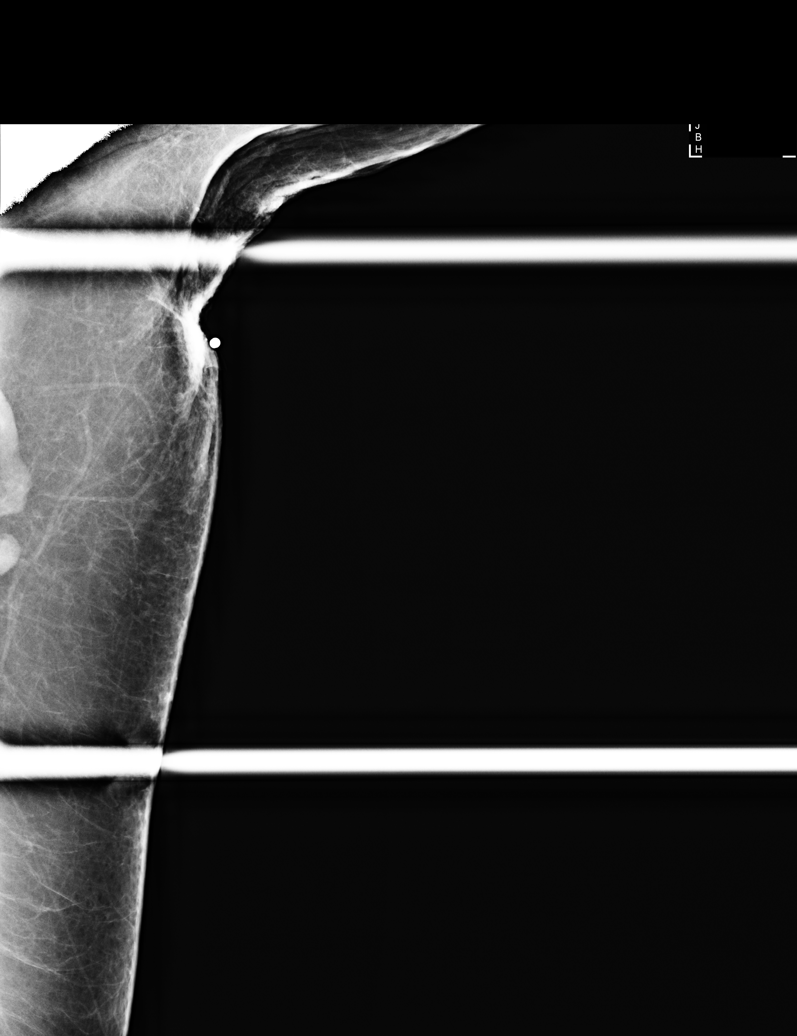

[L ML synth-2D]
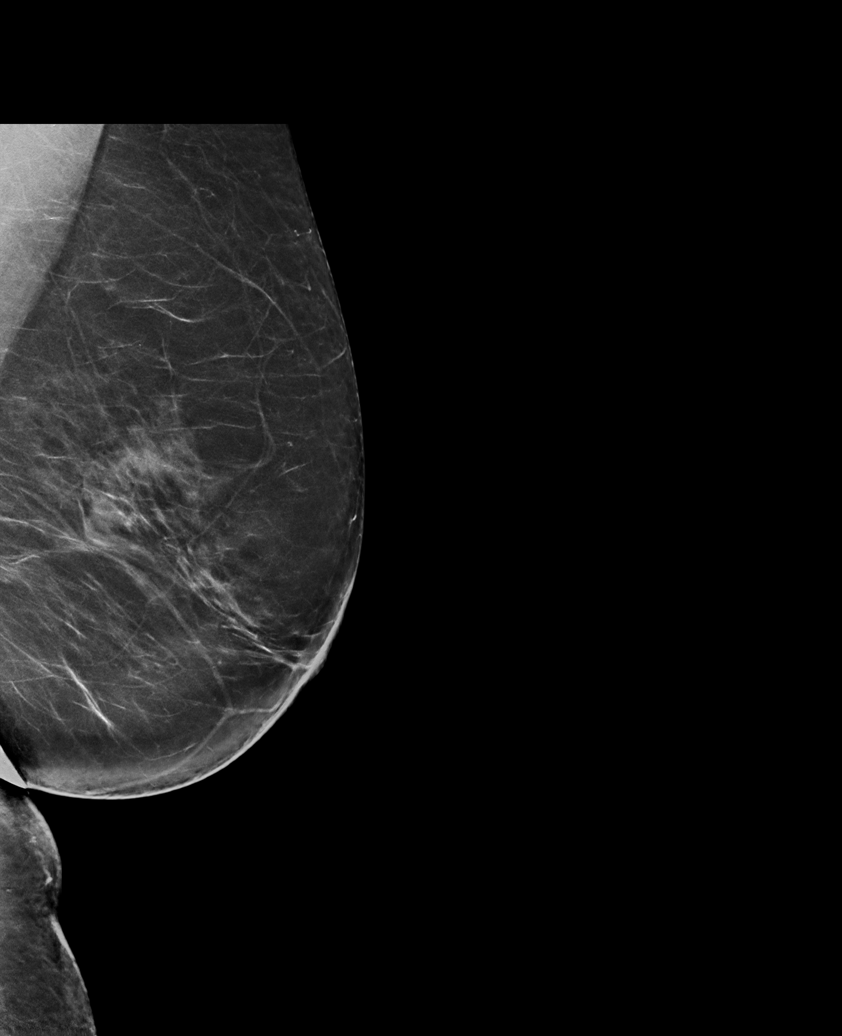

[L ML]
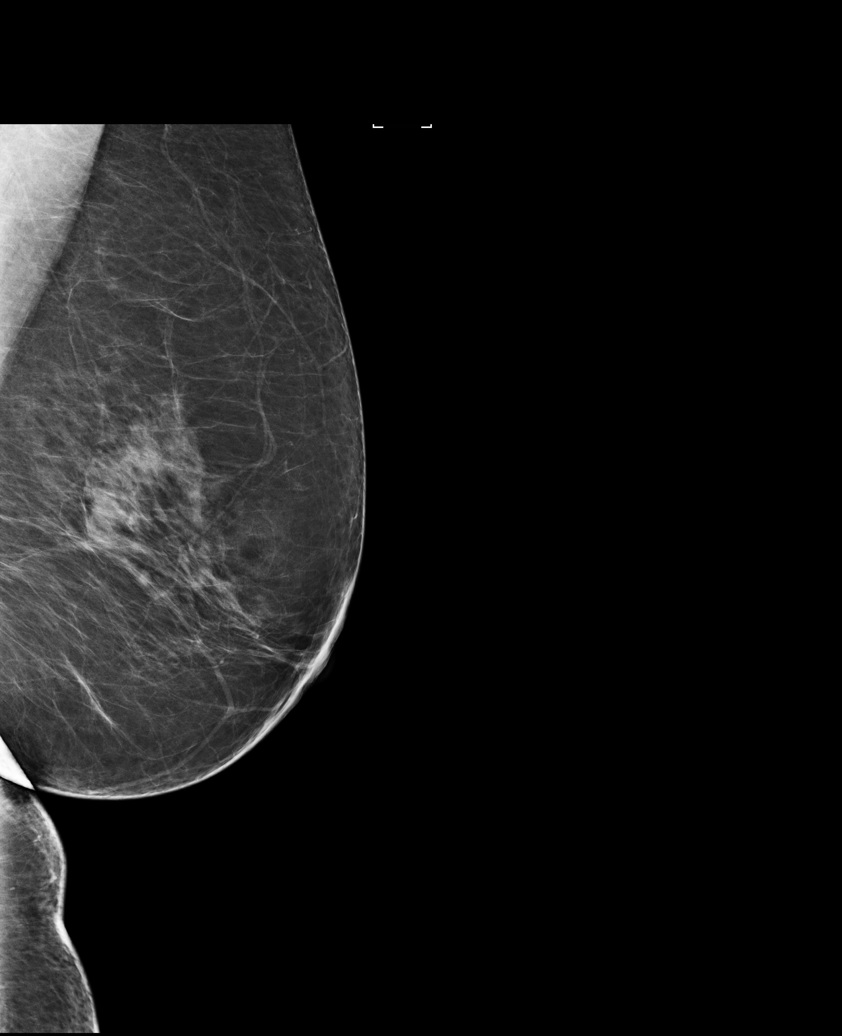

[L MLO synth-2D (1 of 2)]
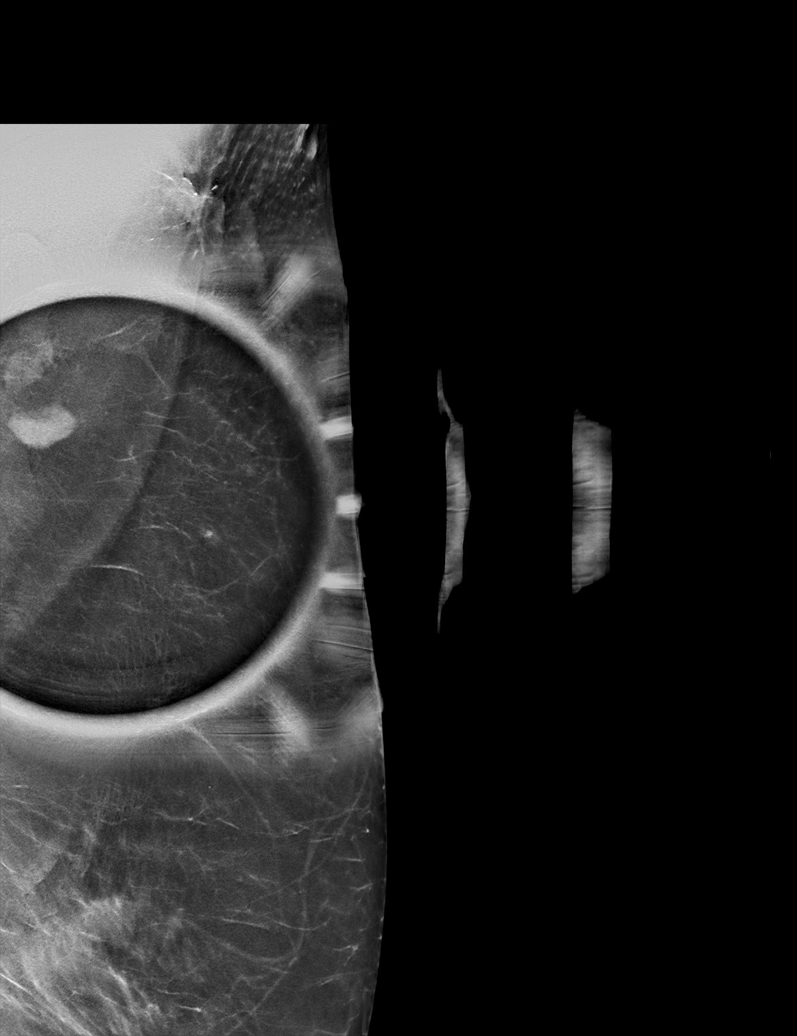

[L MLO synth-2D (2 of 2)]
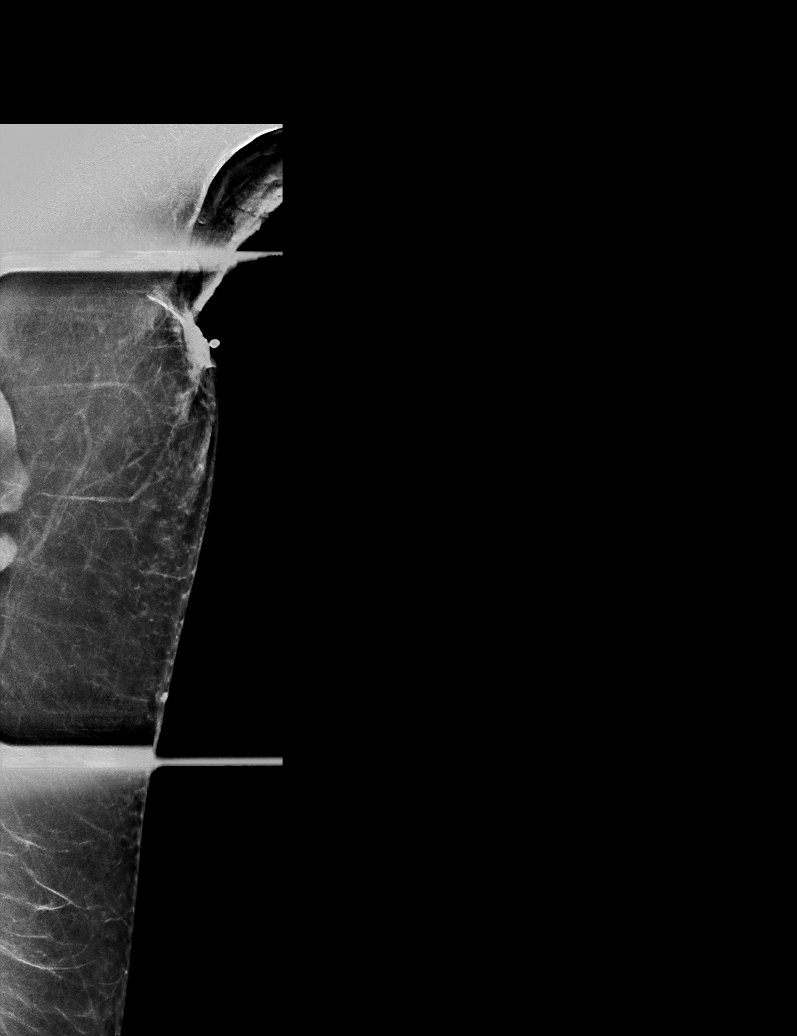

[L MLO (2 of 2)]
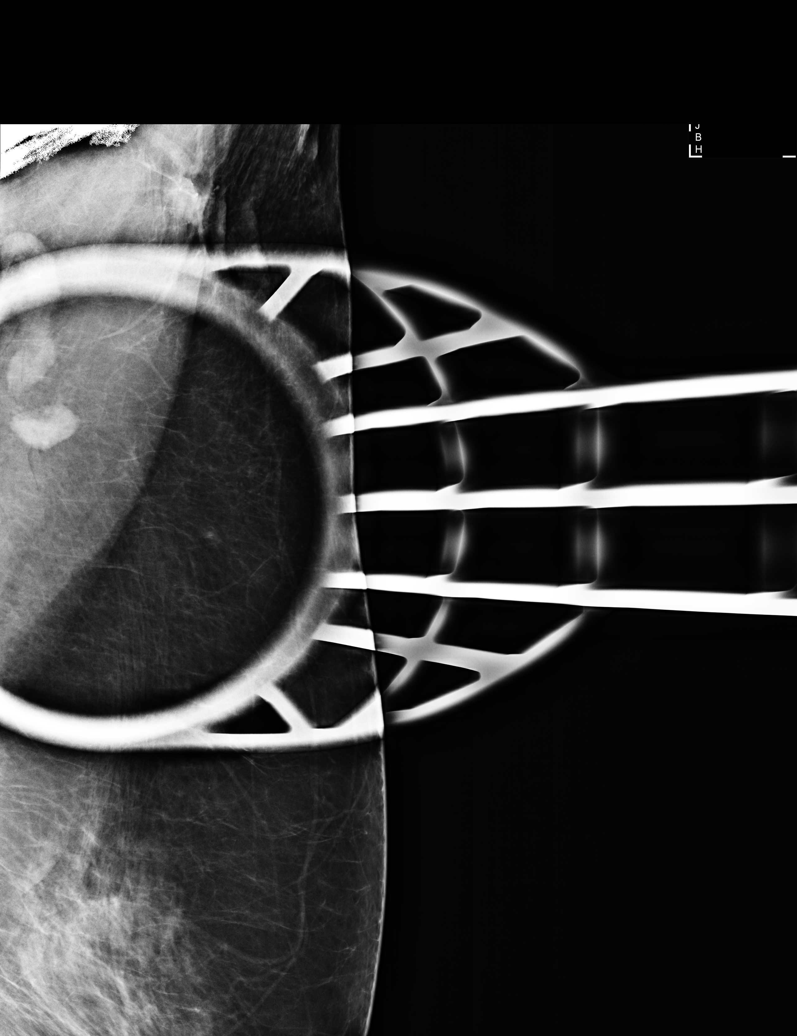

[6 of 21 positions shown; findings below may reference images not displayed]

ACR Breast Density Category b: There are scattered areas of
fibroglandular density.
FINDINGS: Additional views of the left breast including a spot tangential view
of a left breast skin finding noted by the patient were performed.
On the spot tangential view, the area of possible asymmetry seen on
screening mammogram corresponds to an area of skin thickening
underlying the palpable marker.

Mammographic images were processed with CAD.

On physical exam, a small nodular area is felt in the patient's area
of concern within the left axilla.

Targeted ultrasound of the area of concern in the left axilla, which
was shown mammographically to correspond to the asymmetry seen on
screening mammogram, demonstrates a small hypoechoic area within the
skin of the left axilla measuring 7 x 2 x 4 mm with associated skin
thickening. No suspicious abnormality is noted in the underlying
breast.
IMPRESSION: Left axillary skin finding. No mammographic or sonographic evidence
of breast malignancy.

RECOMMENDATION:
1. Clinical follow-up is recommended of the left axillary skin
finding.
2.  Screening mammogram in one year.(Code:GM-9-GJK)

I have discussed the findings and recommendations with the patient.
Results were also provided in writing at the conclusion of the
visit. If applicable, a reminder letter will be sent to the patient
regarding the next appointment.

BI-RADS CATEGORY  2: Benign.

## 2019-02-17 ENCOUNTER — Other Ambulatory Visit: Payer: Self-pay | Admitting: Internal Medicine

## 2019-02-17 DIAGNOSIS — Z1231 Encounter for screening mammogram for malignant neoplasm of breast: Secondary | ICD-10-CM

## 2019-03-20 ENCOUNTER — Ambulatory Visit
Admission: RE | Admit: 2019-03-20 | Discharge: 2019-03-20 | Disposition: A | Discharge status: Home / Self Care | Payer: Medicare Other | Source: Ambulatory Visit | Attending: Internal Medicine | Admitting: Internal Medicine

## 2019-03-20 DIAGNOSIS — Z1231 Encounter for screening mammogram for malignant neoplasm of breast: Secondary | ICD-10-CM | POA: Diagnosis not present

## 2019-06-02 IMAGING — XA IR FLUORO GUIDE NDL PLMT / BX
1 series · 4 of 4 positions shown · IV contrast (isovue)
Comparison: none

CLINICAL DATA: Lumbosacral spondylosis without myelopathy. Low back
and right lower extremity pain. MR demonstrates facet arthropathy
most severe right L5-S1. No previous lumbar surgery.

EXAM:
RIGHT L5-S1 FACET INJECTION UNDER FLUOROSCOPY
FLUOROSCOPY TIME:  32 seconds; 30 mGy; 180 uDymT DAP
TECHNIQUE: The procedure, risks (including but not limited to bleeding,
infection, organ damage ), benefits, and alternatives were explained
to the patient. Questions regarding the procedure were encouraged
and answered. The patient understands and consents to the procedure.
An appropriate skin entry site was determined fluoroscopically and
marked. Operator donned sterile gloves and mask. Site prepped with
betadine, draped in usual sterile fashion, and infiltrated locally
with 1% lidocaine. A 22 gauge spinal needle was advanced to the
posterior margin of the right L5-S1 facet under fluoroscopy.
Diagnostic injection of 0.5 ml Isovue-M 200 confirmed
intra-articular/juxta-articular spread without any intrathecal or
intravascular component. 120mg Depo-Medrol in 1 ml lidocaine 1% was
administered. The patient tolerated the procedure well.
COMPLICATIONS:
COMPLICATIONS
None

[Series 1: run · 4 of 99 frames shown]
[frame 2/99]
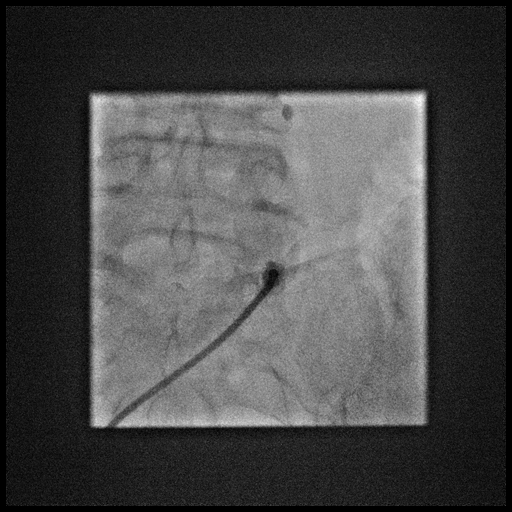
[frame 15/99]
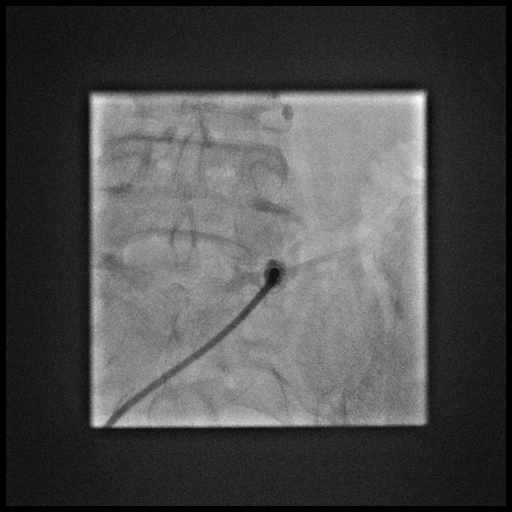
[frame 50/99]
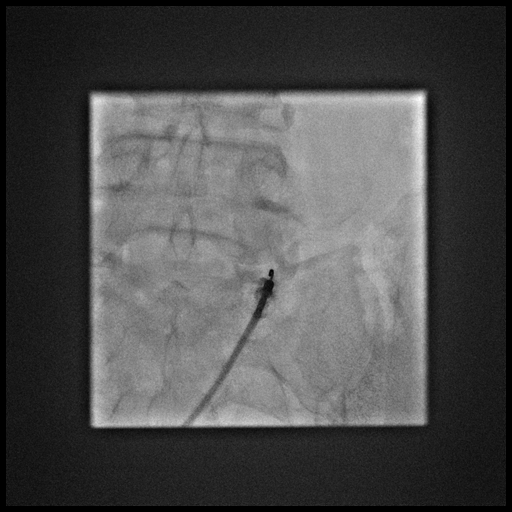
[frame 85/99]
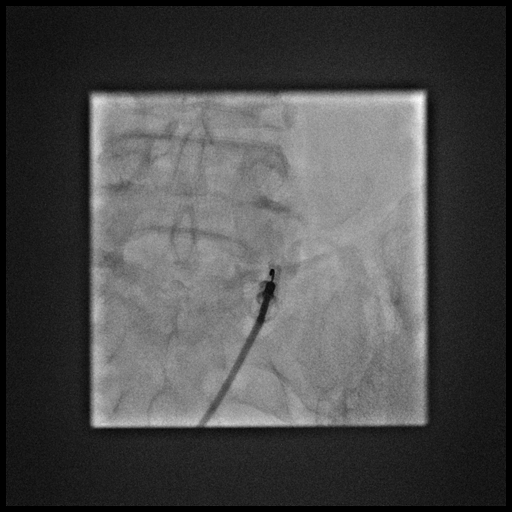

[4 of 4 positions shown; findings below may reference images not displayed]

IMPRESSION: 1. Technically successful right L5-S1 facet injection.

## 2020-02-25 ENCOUNTER — Encounter: Payer: Self-pay | Admitting: Ophthalmology

## 2020-02-25 ENCOUNTER — Other Ambulatory Visit: Payer: Self-pay

## 2020-02-26 ENCOUNTER — Other Ambulatory Visit
Admission: RE | Admit: 2020-02-26 | Discharge: 2020-02-26 | Disposition: A | Discharge status: Home / Self Care | Payer: Medicare Other | Source: Ambulatory Visit | Attending: Ophthalmology | Admitting: Ophthalmology

## 2020-02-26 DIAGNOSIS — Z01812 Encounter for preprocedural laboratory examination: Secondary | ICD-10-CM | POA: Diagnosis present

## 2020-02-26 DIAGNOSIS — Z20822 Contact with and (suspected) exposure to covid-19: Secondary | ICD-10-CM | POA: Diagnosis not present

## 2020-02-26 LAB — SARS CORONAVIRUS 2 (TAT 6-24 HRS): SARS Coronavirus 2: NEGATIVE

## 2020-02-26 NOTE — Discharge Instructions (Signed)

## 2020-03-01 ENCOUNTER — Other Ambulatory Visit: Payer: Self-pay

## 2020-03-01 ENCOUNTER — Encounter
Admission: RE | Disposition: A | Discharge status: Home / Self Care | Payer: Self-pay | Source: Home / Self Care | Attending: Ophthalmology

## 2020-03-01 ENCOUNTER — Ambulatory Visit: Payer: Medicare Other | Admitting: Anesthesiology

## 2020-03-01 ENCOUNTER — Ambulatory Visit
Admission: RE | Admit: 2020-03-01 | Discharge: 2020-03-01 | Disposition: A | Discharge status: Home / Self Care | Payer: Medicare Other | Attending: Ophthalmology | Admitting: Ophthalmology

## 2020-03-01 ENCOUNTER — Encounter: Payer: Self-pay | Admitting: Ophthalmology

## 2020-03-01 DIAGNOSIS — Z7984 Long term (current) use of oral hypoglycemic drugs: Secondary | ICD-10-CM | POA: Insufficient documentation

## 2020-03-01 DIAGNOSIS — E1136 Type 2 diabetes mellitus with diabetic cataract: Secondary | ICD-10-CM | POA: Diagnosis not present

## 2020-03-01 DIAGNOSIS — Z888 Allergy status to other drugs, medicaments and biological substances status: Secondary | ICD-10-CM | POA: Diagnosis not present

## 2020-03-01 DIAGNOSIS — Z79899 Other long term (current) drug therapy: Secondary | ICD-10-CM | POA: Diagnosis not present

## 2020-03-01 DIAGNOSIS — Z880 Allergy status to penicillin: Secondary | ICD-10-CM | POA: Insufficient documentation

## 2020-03-01 DIAGNOSIS — H2512 Age-related nuclear cataract, left eye: Secondary | ICD-10-CM | POA: Diagnosis not present

## 2020-03-01 HISTORY — PX: CATARACT EXTRACTION W/PHACO: SHX586

## 2020-03-01 HISTORY — DX: Palpitations: R00.2

## 2020-03-01 HISTORY — DX: Sciatica, right side: M54.31

## 2020-03-01 HISTORY — DX: Presence of dental prosthetic device (complete) (partial): Z97.2

## 2020-03-01 LAB — GLUCOSE, CAPILLARY
Glucose-Capillary: 180 mg/dL — ABNORMAL HIGH (ref 70–99)
Glucose-Capillary: 180 mg/dL — ABNORMAL HIGH (ref 70–99)

## 2020-03-01 SURGERY — PHACOEMULSIFICATION, CATARACT, WITH IOL INSERTION
Anesthesia: Monitor Anesthesia Care | Site: Eye | Laterality: Left

## 2020-03-01 MED ORDER — ARMC OPHTHALMIC DILATING DROPS
1.0000 "application " | OPHTHALMIC | Status: DC | PRN
  Administered 2020-03-01 (×3): 1 via OPHTHALMIC

## 2020-03-01 MED ORDER — ACETAMINOPHEN 325 MG PO TABS: 325.0000 mg | ORAL_TABLET | ORAL | Status: DC | PRN

## 2020-03-01 MED ORDER — TETRACAINE HCL 0.5 % OP SOLN
1.0000 [drp] | OPHTHALMIC | Status: DC | PRN
  Administered 2020-03-01 (×3): 1 [drp] via OPHTHALMIC

## 2020-03-01 MED ORDER — LACTATED RINGERS IV SOLN: INTRAVENOUS | Status: DC

## 2020-03-01 MED ORDER — LIDOCAINE HCL (PF) 2 % IJ SOLN
INTRAOCULAR | Status: DC | PRN
  Administered 2020-03-01: 1 mL via INTRAOCULAR

## 2020-03-01 MED ORDER — EPINEPHRINE PF 1 MG/ML IJ SOLN
INTRAOCULAR | Status: DC | PRN
  Administered 2020-03-01: 62 mL via OPHTHALMIC

## 2020-03-01 MED ORDER — MIDAZOLAM HCL 2 MG/2ML IJ SOLN
INTRAMUSCULAR | Status: DC | PRN
  Administered 2020-03-01: 2 mg via INTRAVENOUS

## 2020-03-01 MED ORDER — SODIUM HYALURONATE 10 MG/ML IO SOLN
INTRAOCULAR | Status: DC | PRN
  Administered 2020-03-01: 0.55 mL via INTRAOCULAR

## 2020-03-01 MED ORDER — SODIUM HYALURONATE 23 MG/ML IO SOLN
INTRAOCULAR | Status: DC | PRN
  Administered 2020-03-01: 0.6 mL via INTRAOCULAR

## 2020-03-01 MED ORDER — FENTANYL CITRATE (PF) 100 MCG/2ML IJ SOLN
INTRAMUSCULAR | Status: DC | PRN
  Administered 2020-03-01: 50 ug via INTRAVENOUS

## 2020-03-01 MED ORDER — MOXIFLOXACIN HCL 0.5 % OP SOLN
OPHTHALMIC | Status: DC | PRN
  Administered 2020-03-01: 0.2 mL via OPHTHALMIC

## 2020-03-01 MED ORDER — ACETAMINOPHEN 160 MG/5ML PO SOLN: 325.0000 mg | ORAL | Status: DC | PRN

## 2020-03-01 SURGICAL SUPPLY — 19 items

## 2020-03-01 NOTE — Anesthesia Procedure Notes (Signed)
Procedure Name: MAC Date/Time: 03/01/2020 8:50 AM Performed by: Silvana Newness, CRNA Pre-anesthesia Checklist: Patient identified, Emergency Drugs available, Suction available, Patient being monitored and Timeout performed Patient Re-evaluated:Patient Re-evaluated prior to induction Oxygen Delivery Method: Nasal cannula Placement Confirmation: positive ETCO2

## 2020-03-01 NOTE — Anesthesia Postprocedure Evaluation (Signed)
Anesthesia Post Note  Patient: Brenda Mullen  Procedure(s) Performed: CATARACT EXTRACTION PHACO AND INTRAOCULAR LENS PLACEMENT (IOC) LEFT DIABETIC (Left Eye)     Patient location during evaluation: PACU Anesthesia Type: MAC Level of consciousness: awake and alert Pain management: pain level controlled Vital Signs Assessment: post-procedure vital signs reviewed and stable Respiratory status: spontaneous breathing, nonlabored ventilation, respiratory function stable and patient connected to nasal cannula oxygen Cardiovascular status: stable and blood pressure returned to baseline Postop Assessment: no apparent nausea or vomiting Anesthetic complications: no   No complications documented.  Wanda Plump Monish [place]

## 2020-03-01 NOTE — Anesthesia Preprocedure Evaluation (Signed)
Anesthesia Evaluation  Patient identified by MRN, date of birth, ID band Patient awake    History of Anesthesia Complications Negative for: history of anesthetic complications  Airway Mallampati: II  TM Distance: >3 FB Neck ROM: Full    Dental   Pulmonary    Pulmonary exam normal        Cardiovascular hypertension, Normal cardiovascular exam     Neuro/Psych    GI/Hepatic negative GI ROS,   Endo/Other  diabetes  Renal/GU      Musculoskeletal   Abdominal   Peds  Hematology   Anesthesia Other Findings   Reproductive/Obstetrics                             Anesthesia Physical Anesthesia Plan  ASA: II  Anesthesia Plan: MAC   Post-op Pain Management:    Induction: Intravenous  PONV Risk Score and Plan:   Airway Management Planned: Natural Airway  Additional Equipment:   Intra-op Plan:   Post-operative Plan:   Informed Consent: I have reviewed the patients History and Physical, chart, labs and discussed the procedure including the risks, benefits and alternatives for the proposed anesthesia with the patient or authorized representative who has indicated his/her understanding and acceptance.       Plan Discussed with:   Anesthesia Plan Comments:         Anesthesia Quick Evaluation

## 2020-03-01 NOTE — Transfer of Care (Signed)
Immediate Anesthesia Transfer of Care Note  Patient: Brenda Mullen  Procedure(s) Performed: CATARACT EXTRACTION PHACO AND INTRAOCULAR LENS PLACEMENT (IOC) LEFT DIABETIC (Left Eye)  Patient Location: PACU  Anesthesia Type: MAC  Level of Consciousness: awake, alert  and patient cooperative  Airway and Oxygen Therapy: Patient Spontanous Breathing and Patient connected to supplemental oxygen  Post-op Assessment: Post-op Vital signs reviewed, Patient's Cardiovascular Status Stable, Respiratory Function Stable, Patent Airway and No signs of Nausea or vomiting  Post-op Vital Signs: Reviewed and stable  Complications: No complications documented.

## 2020-03-01 NOTE — Op Note (Signed)
OPERATIVE NOTE  Brenda Mullen [id number] 03/01/2020   PREOPERATIVE DIAGNOSIS:  Nuclear sclerotic cataract left eye.  H25.12   POSTOPERATIVE DIAGNOSIS:    Nuclear sclerotic cataract left eye.     PROCEDURE:  Phacoemusification with posterior chamber intraocular lens placement of the left eye   LENS:   Implant Name Type Inv. Item Serial No. Manufacturer Lot No. LRB No. Used Action  LENS IOL TECNIS EYHANCE 13.5 - [id number] Intraocular Lens LENS IOL TECNIS EYHANCE 13.5 [id number] JOHNSON   Left 1 Implanted      Procedure(s) with comments: CATARACT EXTRACTION PHACO AND INTRAOCULAR LENS PLACEMENT (IOC) LEFT DIABETIC (Left) - 2.83 0:21.8  DIB00 +13.5   ULTRASOUND TIME: 0 minutes 21.8 seconds.  CDE 2.83   SURGEON:  Willey Blade, MD, MPH   ANESTHESIA:  Topical with tetracaine drops augmented with 1% preservative-free intracameral lidocaine.  ESTIMATED BLOOD LOSS: <1 mL   COMPLICATIONS:  None.   DESCRIPTION OF PROCEDURE:  The patient was identified in the holding room and transported to the operating room and placed in the supine position under the operating microscope.  The left eye was identified as the operative eye and it was prepped and draped in the usual sterile ophthalmic fashion.   A 1.0 millimeter clear-corneal paracentesis was made at the 5:00 position. 0.5 ml of preservative-free 1% lidocaine with epinephrine was injected into the anterior chamber.  The anterior chamber was filled with Healon 5 viscoelastic.  A 2.4 millimeter keratome was used to make a near-clear corneal incision at the 2:00 position.  A curvilinear capsulorrhexis was made with a cystotome and capsulorrhexis forceps.  Balanced salt solution was used to hydrodissect and hydrodelineate the nucleus.   Phacoemulsification was then used in stop and chop fashion to remove the lens nucleus and epinucleus.  The remaining cortex was then removed using the irrigation and aspiration handpiece. Healon was then placed  into the capsular bag to distend it for lens placement.  A lens was then injected into the capsular bag.  The remaining viscoelastic was aspirated.   Wounds were hydrated with balanced salt solution.  The anterior chamber was inflated to a physiologic pressure with balanced salt solution.  Intracameral vigamox 0.1 mL undiltued was injected into the eye and a drop placed onto the ocular surface.  No wound leaks were noted.  The patient was taken to the recovery room in stable condition without complications of anesthesia or surgery  Willey Blade 03/01/2020, 9:08 AM

## 2020-03-01 NOTE — H&P (Signed)
[facility]   Primary Care Physician:  Gracelyn Nurse, MD Ophthalmologist: Dr. Willey Blade  Pre-Procedure History & Physical: HPI:  Brenda Mullen is a 69 y.o. female here for cataract surgery.   Past Medical History:  Diagnosis Date  . Diabetes mellitus without complication (HCC)   . Hypertension   . Palpitations   . Sciatica of right side   . Wears dentures    full lower    Past Surgical History:  Procedure Laterality Date  . IR FLUORO GUIDED NEEDLE PLC ASPIRATION/INJECTION LOC  09/06/2017    Prior to Admission medications   Medication Sig Start Date End Date Taking? Authorizing Provider  ALPRAZOLAM PO Take by mouth as needed.   Yes [provider]  amLODipine-benazepril (LOTREL) 5-10 MG capsule Take 1 capsule by mouth daily.   Yes [provider]  atenolol (TENORMIN) 25 MG tablet Take 25 mg by mouth daily.   Yes [provider]  diphenhydramine-acetaminophen (TYLENOL PM) 25-500 MG TABS tablet Take 1 tablet by mouth at bedtime as needed.   Yes [provider]  fluticasone (FLONASE) 50 MCG/ACT nasal spray Place 2 sprays into both nostrils daily.   Yes [provider]  glipiZIDE (GLUCOTROL) 5 MG tablet Take 5 mg by mouth daily before breakfast.   Yes [provider]  hydrochlorothiazide (HYDRODIURIL) 12.5 MG tablet Take 12.5 mg by mouth daily.   Yes [provider]  insulin glargine (LANTUS) 100 UNIT/ML injection Inject 18 Units into the skin daily. AM   Yes [provider]  Oxymetazoline HCl (VICKS SINEX NA) Place into the nose as needed.   Yes [provider]  pioglitazone (ACTOS) 15 MG tablet Take 15 mg by mouth daily.   Yes [provider]    Allergies as of 01/06/2020 - Review Complete 09/20/2017  Allergen Reaction Noted  . Metformin and related Diarrhea 12/17/2014  . Penicillins Rash 12/17/2014    Family History  Problem Relation Age of Onset  . Cancer Sister         uterine  . Breast cancer Neg Hx     Social History   Socioeconomic History  . Marital status: Married    Spouse name: Not on file  . Number of children: Not on file  . Years of education: Not on file  . Highest education level: Not on file  Occupational History  . Not on file  Tobacco Use  . Smoking status: Never Smoker  . Smokeless tobacco: Never Used  Vaping Use  . Vaping Use: Never used  Substance and Sexual Activity  . Alcohol use: No  . Drug use: No  . Sexual activity: Not on file  Other Topics Concern  . Not on file  Social History Narrative  . Not on file   Social Determinants of Health   Financial Resource Strain: Not on file  Food Insecurity: Not on file  Transportation Needs: Not on file  Physical Activity: Not on file  Stress: Not on file  Social Connections: Not on file  Intimate Partner Violence: Not on file    Review of Systems: See HPI, otherwise negative ROS  Physical Exam: BP (!) 141/62   Pulse 65   Temp (!) 97.1 F (36.2 C) (Temporal)   Ht 5\' 3"  (1.6 m)   Wt 98.4 kg   SpO2 100%   BMI 38.44 kg/m  General:   Alert,  pleasant and cooperative in NAD Head:  Normocephalic and atraumatic. Respiratory:  Normal work  of breathing.  Impression/Plan: Brenda Mullen is here for cataract surgery.  Risks, benefits, limitations, and alternatives regarding cataract surgery have been reviewed with the patient.  Questions have been answered.  All parties agreeable.   Willey Blade, MD  03/01/2020, 8:35 AM

## 2020-04-14 ENCOUNTER — Encounter: Payer: Self-pay | Admitting: Anesthesiology

## 2020-04-14 ENCOUNTER — Encounter: Payer: Self-pay | Admitting: Ophthalmology

## 2020-04-22 ENCOUNTER — Other Ambulatory Visit
Admission: RE | Admit: 2020-04-22 | Discharge: 2020-04-22 | Disposition: A | Discharge status: Home / Self Care | Payer: Medicare Other | Source: Ambulatory Visit | Attending: Ophthalmology | Admitting: Ophthalmology

## 2020-04-22 ENCOUNTER — Other Ambulatory Visit: Payer: Self-pay

## 2020-04-22 DIAGNOSIS — Z20822 Contact with and (suspected) exposure to covid-19: Secondary | ICD-10-CM | POA: Insufficient documentation

## 2020-04-22 DIAGNOSIS — Z01812 Encounter for preprocedural laboratory examination: Secondary | ICD-10-CM | POA: Insufficient documentation

## 2020-04-22 LAB — SARS CORONAVIRUS 2 (TAT 6-24 HRS): SARS Coronavirus 2: NEGATIVE

## 2020-04-23 NOTE — Discharge Instructions (Signed)

## 2020-04-26 ENCOUNTER — Other Ambulatory Visit: Payer: Self-pay

## 2020-04-26 ENCOUNTER — Ambulatory Visit
Admission: RE | Admit: 2020-04-26 | Discharge: 2020-04-26 | Disposition: A | Discharge status: Home / Self Care | Payer: Medicare Other | Attending: Ophthalmology | Admitting: Ophthalmology

## 2020-04-26 ENCOUNTER — Emergency Department
Admission: EM | Admit: 2020-04-26 | Discharge: 2020-04-26 | Disposition: A | Discharge status: Home / Self Care | Payer: Medicare Other | Source: Home / Self Care | Attending: Emergency Medicine | Admitting: Emergency Medicine

## 2020-04-26 ENCOUNTER — Encounter
Admission: RE | Disposition: A | Discharge status: Home / Self Care | Payer: Self-pay | Source: Home / Self Care | Attending: Ophthalmology

## 2020-04-26 DIAGNOSIS — B373 Candidiasis of vulva and vagina: Secondary | ICD-10-CM | POA: Insufficient documentation

## 2020-04-26 DIAGNOSIS — I10 Essential (primary) hypertension: Secondary | ICD-10-CM | POA: Insufficient documentation

## 2020-04-26 DIAGNOSIS — R319 Hematuria, unspecified: Secondary | ICD-10-CM | POA: Insufficient documentation

## 2020-04-26 DIAGNOSIS — B3731 Acute candidiasis of vulva and vagina: Secondary | ICD-10-CM

## 2020-04-26 DIAGNOSIS — Z794 Long term (current) use of insulin: Secondary | ICD-10-CM | POA: Insufficient documentation

## 2020-04-26 DIAGNOSIS — N3001 Acute cystitis with hematuria: Secondary | ICD-10-CM

## 2020-04-26 DIAGNOSIS — Z7984 Long term (current) use of oral hypoglycemic drugs: Secondary | ICD-10-CM | POA: Insufficient documentation

## 2020-04-26 DIAGNOSIS — E119 Type 2 diabetes mellitus without complications: Secondary | ICD-10-CM | POA: Insufficient documentation

## 2020-04-26 DIAGNOSIS — Z5309 Procedure and treatment not carried out because of other contraindication: Secondary | ICD-10-CM | POA: Insufficient documentation

## 2020-04-26 DIAGNOSIS — H269 Unspecified cataract: Secondary | ICD-10-CM | POA: Diagnosis not present

## 2020-04-26 DIAGNOSIS — Z79899 Other long term (current) drug therapy: Secondary | ICD-10-CM | POA: Insufficient documentation

## 2020-04-26 LAB — URINALYSIS, COMPLETE (UACMP) WITH MICROSCOPIC
Bacteria, UA: NONE SEEN
RBC / HPF: >50 RBC/hpf — ABNORMAL HIGH (ref 0–5)
Specific Gravity, Urine: 1.036 — ABNORMAL HIGH (ref 1.005–1.030)
Squamous Epithelial / HPF: NONE SEEN (ref 0–5)
WBC, UA: >50 WBC/hpf — ABNORMAL HIGH (ref 0–5)

## 2020-04-26 SURGERY — PHACOEMULSIFICATION, CATARACT, WITH IOL INSERTION
Anesthesia: Topical | Laterality: Right

## 2020-04-26 MED ORDER — FLUCONAZOLE 150 MG PO TABS: 150.0000 mg | ORAL_TABLET | Freq: Every day | ORAL | 0 refills | Status: DC

## 2020-04-26 MED ORDER — FLUCONAZOLE 150 MG PO TABS: 150.0000 mg | ORAL_TABLET | Freq: Every day | ORAL | 0 refills | Status: AC

## 2020-04-26 MED ORDER — CEPHALEXIN 500 MG PO CAPS: 500.0000 mg | ORAL_CAPSULE | Freq: Two times a day (BID) | ORAL | 0 refills | Status: AC

## 2020-04-26 SURGICAL SUPPLY — 16 items
CANNULA ANT/CHMB 27GA (MISCELLANEOUS) ×4 IMPLANT
DISSECTOR HYDRO NUCLEUS 50X22 (MISCELLANEOUS) ×2 IMPLANT
GLOVE SURG LX 7.5 STRW (GLOVE) ×1
GLOVE SURG LX STRL 7.5 STRW (GLOVE) ×1 IMPLANT
GLOVE SURG SYN 8.5  E (GLOVE) ×1
GLOVE SURG SYN 8.5 E (GLOVE) ×1 IMPLANT
GOWN STRL REUS W/ TWL LRG LVL3 (GOWN DISPOSABLE) ×2 IMPLANT
GOWN STRL REUS W/TWL LRG LVL3 (GOWN DISPOSABLE) ×2
MARKER SKIN DUAL TIP RULER LAB (MISCELLANEOUS) ×2 IMPLANT
PACK DR. KING ARMS (PACKS) ×2 IMPLANT
PACK EYE AFTER SURG (MISCELLANEOUS) ×2 IMPLANT
PACK OPTHALMIC (MISCELLANEOUS) ×2 IMPLANT
SYR 3ML LL SCALE MARK (SYRINGE) ×2 IMPLANT
SYR TB 1ML LUER SLIP (SYRINGE) ×2 IMPLANT
WATER STERILE IRR 250ML POUR (IV SOLUTION) ×2 IMPLANT
WIPE NON LINTING 3.25X3.25 (MISCELLANEOUS) ×2 IMPLANT

## 2020-04-26 NOTE — Discharge Instructions (Signed)
Please follow up with your primary care provider in about 10 days.  Return to the ER for symptoms that change or worsen if unable to schedule an appointment.

## 2020-04-26 NOTE — ED Triage Notes (Addendum)
Pt arrives w/reports of hematuria since this morning. States she was scheduled to have eye surgery this am, but they cannot do until this is evaluated. Takes no thinners, c/o feeling full bladder, but unable to fully void.

## 2020-04-26 NOTE — ED Triage Notes (Signed)
Pt states she was going to have cataract surgery this morning and is having difficulty to pass urine with a small of amount of blood in her urine,. Denies any pain

## 2020-04-26 NOTE — ED Notes (Signed)
See triage note  Presents with some diff voiding this am   States she felt like she could only void a small amt this am  Then noticed some blood  Denies any pain but is having some pressure

## 2020-04-26 NOTE — ED Provider Notes (Signed)
[facility] Emergency Department Provider Note ____________________________________________   Event Date/Time   First MD Initiated Contact with Patient 04/26/20 [phone]     (approximate)  I have reviewed the triage vital signs and the nursing notes.   HISTORY  Chief Complaint Hematuria  HPI Brenda Mullen is a 70 y.o. female with history of diabetes, hypertension, palpitations presents to the emergency department after noticing hematuria.  She was scheduled to have cataract extraction but after discovering the issue, they advised her to come to the emergency department for evaluation before the procedure could be performed.  She states that she has had some urinary hesitancy and feels as if she needs to urinate but only a small amount at a time comes out.  She denies back pain.  She does states that she has had a recent vaginal yeast infection that she was treating with Monistat.         Past Medical History:  Diagnosis Date  . Diabetes mellitus without complication (HCC)   . Hypertension   . Palpitations   . Sciatica of right side   . Wears dentures    full lower    There are no problems to display for this patient.   Past Surgical History:  Procedure Laterality Date  . CATARACT EXTRACTION W/PHACO Left 03/01/2020   Procedure: CATARACT EXTRACTION PHACO AND INTRAOCULAR LENS PLACEMENT (IOC) LEFT DIABETIC;  Surgeon: Nevada Crane, MD;  Location: [facility];  Service: Ophthalmology;  Laterality: Left;  2.83 0:21.8  . IR FLUORO GUIDED NEEDLE PLC ASPIRATION/INJECTION LOC  09/06/2017    Prior to Admission medications   Medication Sig Start Date End Date Taking? Authorizing Provider  cephALEXin (KEFLEX) 500 MG capsule Take 1 capsule (500 mg total) by mouth 2 (two) times daily for 7 days. 04/26/20 05/03/20 Yes Ladarion Munyon B, FNP  ALPRAZOLAM PO Take by mouth as needed.    [provider]  amLODipine-benazepril (LOTREL) 5-10 MG capsule  Take 1 capsule by mouth daily.    [provider]  atenolol (TENORMIN) 25 MG tablet Take 25 mg by mouth daily.    [provider]  diphenhydramine-acetaminophen (TYLENOL PM) 25-500 MG TABS tablet Take 1 tablet by mouth at bedtime as needed.    [provider]  fluconazole (DIFLUCAN) 150 MG tablet Take 1 tablet (150 mg total) by mouth daily. 04/26/20   Nanea Jared, Rulon Eisenmenger B, FNP  fluticasone (FLONASE) 50 MCG/ACT nasal spray Place 2 sprays into both nostrils daily.    [provider]  glipiZIDE (GLUCOTROL) 5 MG tablet Take 5 mg by mouth daily before breakfast.    [provider]  hydrochlorothiazide (HYDRODIURIL) 12.5 MG tablet Take 12.5 mg by mouth daily.    [provider]  insulin glargine (LANTUS) 100 UNIT/ML injection Inject 18 Units into the skin daily. AM    [provider]  Oxymetazoline HCl (VICKS SINEX NA) Place into the nose as needed.    [provider]  pioglitazone (ACTOS) 15 MG tablet Take 15 mg by mouth daily.    [provider]    Allergies Metformin and related and Penicillins  Family History  Problem Relation Age of Onset  . Cancer Sister        uterine  . Breast cancer Neg Hx     Social History Social History   Tobacco Use  . Smoking status: Never Smoker  . Smokeless tobacco: Never Used  Vaping Use  . Vaping Use: Never used  Substance  Use Topics  . Alcohol use: No  . Drug use: No    Review of Systems  Constitutional: No fever/chills Eyes: No visual changes. ENT: No sore throat. Cardiovascular: Denies chest pain. Respiratory: Denies shortness of breath. Gastrointestinal: No abdominal pain.  No nausea, no vomiting.  No diarrhea.  No constipation. Genitourinary: Negative for dysuria. Positive for urinary hesitancy. Musculoskeletal: Negative for back pain. Skin: Negative for rash. Neurological: Negative for headaches, focal weakness or  numbness. ____________________________________________   PHYSICAL EXAM:  VITAL SIGNS: ED Triage Vitals [04/26/20 0818]  Enc Vitals Group     BP (!) 159/76     Pulse Rate (!) 105     Resp 17     Temp 98.4 F (36.9 C)     Temp Source Oral     SpO2 98 %     Weight 215 lb (97.5 kg)     Height 5' 3.5" (1.613 m)     Head Circumference      Peak Flow      Pain Score 0     Pain Loc      Pain Edu?      Excl. in GC?     Constitutional: Alert and oriented. Well appearing and in no acute distress. Eyes: Conjunctivae are normal. Head: Atraumatic. Nose: No congestion/rhinnorhea. Mouth/Throat: Mucous membranes are moist. Oropharynx non-erythematous. Neck: No stridor.   Hematological/Lymphatic/Immunilogical: No cervical lymphadenopathy. Cardiovascular: Normal rate, regular rhythm. Grossly normal heart sounds.  Good peripheral circulation.  Respiratory: Normal respiratory effort.  No retractions. Lungs CTAB. Gastrointestinal: Soft and nontender. No distention. No abdominal bruits. No CVA tenderness. Genitourinary:  Musculoskeletal: No lower extremity tenderness nor edema.  No joint effusions. Neurologic:  Normal speech and language. No gross focal neurologic deficits are appreciated. No gait instability. Skin:  Skin is warm, dry and intact. No rash noted. Psychiatric: Mood and affect are normal. Speech and behavior are normal.  ____________________________________________   LABS (all labs ordered are listed, but only abnormal results are displayed)  Labs Reviewed  URINALYSIS, COMPLETE (UACMP) WITH MICROSCOPIC - Abnormal; Notable for the following components:      Result Value   Color, Urine BROWN (*)    APPearance TURBID (*)    Specific Gravity, Urine 1.036 (*)    Glucose, UA   (*)    Value: TEST NOT REPORTED DUE TO COLOR INTERFERENCE OF URINE PIGMENT   Hgb urine dipstick   (*)    Value: TEST NOT REPORTED DUE TO COLOR INTERFERENCE OF URINE PIGMENT   Bilirubin Urine   (*)     Value: TEST NOT REPORTED DUE TO COLOR INTERFERENCE OF URINE PIGMENT   Ketones, ur   (*)    Value: TEST NOT REPORTED DUE TO COLOR INTERFERENCE OF URINE PIGMENT   Protein, ur   (*)    Value: TEST NOT REPORTED DUE TO COLOR INTERFERENCE OF URINE PIGMENT   Nitrite   (*)    Value: TEST NOT REPORTED DUE TO COLOR INTERFERENCE OF URINE PIGMENT   Leukocytes,Ua   (*)    Value: TEST NOT REPORTED DUE TO COLOR INTERFERENCE OF URINE PIGMENT   RBC / HPF >50 (*)    WBC, UA >50 (*)    All other components within normal limits   ____________________________________________  EKG  Not indicated ____________________________________________  RADIOLOGY  ED MD interpretation:    Not indicated.  I, Kem Boroughs, personally viewed and evaluated these images (plain radiographs) as part of my medical decision making, as well as reviewing  the written report by the radiologist.  Official radiology report(s): No results found.  ____________________________________________   PROCEDURES  Procedure(s) performed (including Critical Care):  Procedures  ____________________________________________   INITIAL IMPRESSION / ASSESSMENT AND PLAN     70 year old female presenting to the emergency department for evaluation of hematuria.  See HPI for further details.  Plan will be to get a urinalysis  DIFFERENTIAL DIAGNOSIS  Acute cystitis, pyelonephritis, hematuria  ED COURSE  Urine does show significant amount of hemoglobin and red blood cells.  There are greater than 50 white blood cells as well as yeast and bacteria.  She will be treated with 1 dose of Diflucan and given a prescription for Keflex to be taken over the next week.  She was encouraged to have her urine retested in about 10 days.  She is to see her primary care provider sooner if she develops other symptoms of concern.    ___________________________________________   FINAL CLINICAL IMPRESSION(S) / ED DIAGNOSES  Final diagnoses:   Acute cystitis with hematuria  Vaginal candidiasis     ED Discharge Orders         Ordered    fluconazole (DIFLUCAN) 150 MG tablet  Daily,   Status:  Discontinued        04/26/20 1042    cephALEXin (KEFLEX) 500 MG capsule  2 times daily        04/26/20 1042    fluconazole (DIFLUCAN) 150 MG tablet  Daily        04/26/20 1043           Aretha H Baltes was evaluated in Emergency Department on 04/26/2020 for the symptoms described in the history of present illness. She was evaluated in the context of the global COVID-19 pandemic, which necessitated consideration that the patient might be at risk for infection with the SARS-CoV-2 virus that causes COVID-19. Institutional protocols and algorithms that pertain to the evaluation of patients at risk for COVID-19 are in a state of rapid change based on information released by regulatory bodies including the CDC and federal and state organizations. These policies and algorithms were followed during the patient's care in the ED.   Note:  This document was prepared using Dragon voice recognition software and may include unintentional dictation errors.   Chinita Pester, FNP 04/26/20 1323    Delton Prairie, MD 04/26/20 1537

## 2020-04-26 NOTE — Progress Notes (Signed)
Patient canceled per anesthesia, patient stated she "is peeing blood" this morning.  Instructed to go to her PCP or urgent care to be assessed.

## 2020-04-26 NOTE — H&P (Signed)
This patients surgery was rescheduled prior to examination on the day of surgery due to blood in the urine.

## 2020-04-29 ENCOUNTER — Other Ambulatory Visit
Admission: RE | Admit: 2020-04-29 | Discharge: 2020-04-29 | Disposition: A | Discharge status: Home / Self Care | Payer: Medicare Other | Source: Ambulatory Visit | Attending: Ophthalmology | Admitting: Ophthalmology

## 2020-04-29 ENCOUNTER — Other Ambulatory Visit: Payer: Self-pay

## 2020-04-29 DIAGNOSIS — Z01812 Encounter for preprocedural laboratory examination: Secondary | ICD-10-CM | POA: Insufficient documentation

## 2020-04-29 DIAGNOSIS — Z20822 Contact with and (suspected) exposure to covid-19: Secondary | ICD-10-CM | POA: Diagnosis not present

## 2020-04-29 LAB — SARS CORONAVIRUS 2 (TAT 6-24 HRS): SARS Coronavirus 2: NEGATIVE

## 2020-04-30 NOTE — Discharge Instructions (Signed)

## 2020-05-03 ENCOUNTER — Ambulatory Visit: Payer: Medicare Other | Admitting: Anesthesiology

## 2020-05-03 ENCOUNTER — Ambulatory Visit
Admission: RE | Admit: 2020-05-03 | Discharge: 2020-05-03 | Disposition: A | Discharge status: Home / Self Care | Payer: Medicare Other | Attending: Ophthalmology | Admitting: Ophthalmology

## 2020-05-03 ENCOUNTER — Other Ambulatory Visit: Payer: Self-pay

## 2020-05-03 ENCOUNTER — Encounter
Admission: RE | Disposition: A | Discharge status: Home / Self Care | Payer: Self-pay | Source: Home / Self Care | Attending: Ophthalmology

## 2020-05-03 ENCOUNTER — Encounter: Payer: Self-pay | Admitting: Ophthalmology

## 2020-05-03 DIAGNOSIS — Z794 Long term (current) use of insulin: Secondary | ICD-10-CM | POA: Diagnosis not present

## 2020-05-03 DIAGNOSIS — Z888 Allergy status to other drugs, medicaments and biological substances status: Secondary | ICD-10-CM | POA: Insufficient documentation

## 2020-05-03 DIAGNOSIS — H2511 Age-related nuclear cataract, right eye: Secondary | ICD-10-CM | POA: Insufficient documentation

## 2020-05-03 DIAGNOSIS — Z79899 Other long term (current) drug therapy: Secondary | ICD-10-CM | POA: Insufficient documentation

## 2020-05-03 DIAGNOSIS — Z880 Allergy status to penicillin: Secondary | ICD-10-CM | POA: Insufficient documentation

## 2020-05-03 DIAGNOSIS — I10 Essential (primary) hypertension: Secondary | ICD-10-CM | POA: Diagnosis not present

## 2020-05-03 DIAGNOSIS — E1136 Type 2 diabetes mellitus with diabetic cataract: Secondary | ICD-10-CM | POA: Diagnosis not present

## 2020-05-03 HISTORY — PX: CATARACT EXTRACTION W/PHACO: SHX586

## 2020-05-03 LAB — GLUCOSE, CAPILLARY
Glucose-Capillary: 189 mg/dL — ABNORMAL HIGH (ref 70–99)
Glucose-Capillary: 171 mg/dL — ABNORMAL HIGH (ref 70–99)

## 2020-05-03 SURGERY — PHACOEMULSIFICATION, CATARACT, WITH IOL INSERTION
Anesthesia: Monitor Anesthesia Care | Site: Eye | Laterality: Right

## 2020-05-03 MED ORDER — TETRACAINE HCL 0.5 % OP SOLN
1.0000 [drp] | OPHTHALMIC | Status: DC | PRN
  Administered 2020-05-03 (×3): 1 [drp] via OPHTHALMIC

## 2020-05-03 MED ORDER — MOXIFLOXACIN HCL 0.5 % OP SOLN
OPHTHALMIC | Status: DC | PRN
  Administered 2020-05-03: 0.2 mL via OPHTHALMIC

## 2020-05-03 MED ORDER — MIDAZOLAM HCL 2 MG/2ML IJ SOLN
INTRAMUSCULAR | Status: DC | PRN
  Administered 2020-05-03: 2 mg via INTRAVENOUS

## 2020-05-03 MED ORDER — SODIUM HYALURONATE 10 MG/ML IO SOLN
INTRAOCULAR | Status: DC | PRN
  Administered 2020-05-03: 0.55 mL via INTRAOCULAR

## 2020-05-03 MED ORDER — SODIUM HYALURONATE 23 MG/ML IO SOLN
INTRAOCULAR | Status: DC | PRN
  Administered 2020-05-03: 0.6 mL via INTRAOCULAR

## 2020-05-03 MED ORDER — LIDOCAINE HCL (PF) 2 % IJ SOLN
INTRAOCULAR | Status: DC | PRN
  Administered 2020-05-03: 1 mL via INTRAOCULAR

## 2020-05-03 MED ORDER — FENTANYL CITRATE (PF) 100 MCG/2ML IJ SOLN
INTRAMUSCULAR | Status: DC | PRN
  Administered 2020-05-03 (×2): 50 ug via INTRAVENOUS

## 2020-05-03 MED ORDER — EPINEPHRINE PF 1 MG/ML IJ SOLN
INTRAOCULAR | Status: DC | PRN
  Administered 2020-05-03: 72 mL via OPHTHALMIC

## 2020-05-03 MED ORDER — ARMC OPHTHALMIC DILATING DROPS
1.0000 "application " | OPHTHALMIC | Status: DC | PRN
  Administered 2020-05-03 (×3): 1 via OPHTHALMIC

## 2020-05-03 SURGICAL SUPPLY — 19 items

## 2020-05-03 NOTE — Anesthesia Preprocedure Evaluation (Addendum)
Anesthesia Evaluation  Patient identified by MRN, date of birth, ID band Patient awake    Reviewed: Allergy & Precautions, NPO status , Patient's Chart, lab work & pertinent test results  History of Anesthesia Complications Negative for: history of anesthetic complications  Airway Mallampati: II  TM Distance: >3 FB Neck ROM: Full    Dental  (+) Upper Dentures   Pulmonary    Pulmonary exam normal        Cardiovascular hypertension, Normal cardiovascular exam     Neuro/Psych  Neuromuscular disease (sciatica) negative psych ROS   GI/Hepatic negative GI ROS, Neg liver ROS,   Endo/Other  diabetes, Type 2, Insulin Dependent  Renal/GU      Musculoskeletal negative musculoskeletal ROS (+)   Abdominal (+) + obese,   Peds  Hematology negative hematology ROS (+)   Anesthesia Other Findings   Reproductive/Obstetrics                             Anesthesia Physical  Anesthesia Plan  ASA: II  Anesthesia Plan: MAC   Post-op Pain Management:    Induction: Intravenous  PONV Risk Score and Plan: 2 and TIVA, Midazolam and Treatment may vary due to age or medical condition  Airway Management Planned: Natural Airway and Nasal Cannula  Additional Equipment: None  Intra-op Plan:   Post-operative Plan:   Informed Consent: I have reviewed the patients History and Physical, chart, labs and discussed the procedure including the risks, benefits and alternatives for the proposed anesthesia with the patient or authorized representative who has indicated his/her understanding and acceptance.     Dental advisory given  Plan Discussed with: CRNA  Anesthesia Plan Comments:         Anesthesia Quick Evaluation

## 2020-05-03 NOTE — H&P (Signed)
[facility]   Primary Care Physician:  Gracelyn Nurse, MD Ophthalmologist: Dr. Willey Blade  Pre-Procedure History & Physical: HPI:  Brenda Mullen is a 70 y.o. female here for cataract surgery.   Past Medical History:  Diagnosis Date  . Diabetes mellitus without complication (HCC)   . Hypertension   . Palpitations   . Sciatica of right side   . Wears dentures    full lower    Past Surgical History:  Procedure Laterality Date  . CATARACT EXTRACTION W/PHACO Left 03/01/2020   Procedure: CATARACT EXTRACTION PHACO AND INTRAOCULAR LENS PLACEMENT (IOC) LEFT DIABETIC;  Surgeon: Nevada Crane, MD;  Location: [facility];  Service: Ophthalmology;  Laterality: Left;  2.83 0:21.8  . IR FLUORO GUIDED NEEDLE PLC ASPIRATION/INJECTION LOC  09/06/2017    Prior to Admission medications   Medication Sig Start Date End Date Taking? Authorizing Provider  amLODipine-benazepril (LOTREL) 5-10 MG capsule Take 1 capsule by mouth daily.   Yes [provider]  atenolol (TENORMIN) 25 MG tablet Take 25 mg by mouth daily.   Yes [provider]  diphenhydramine-acetaminophen (TYLENOL PM) 25-500 MG TABS tablet Take 1 tablet by mouth at bedtime as needed.   Yes [provider]  fluconazole (DIFLUCAN) 150 MG tablet Take 1 tablet (150 mg total) by mouth daily. 04/26/20  Yes Triplett, Cari B, FNP  fluticasone (FLONASE) 50 MCG/ACT nasal spray Place 2 sprays into both nostrils daily.   Yes [provider]  glipiZIDE (GLUCOTROL) 5 MG tablet Take 5 mg by mouth daily before breakfast.   Yes [provider]  hydrochlorothiazide (HYDRODIURIL) 12.5 MG tablet Take 12.5 mg by mouth daily.   Yes [provider]  insulin glargine (LANTUS) 100 UNIT/ML injection Inject 18 Units into the skin daily. AM   Yes [provider]  ALPRAZOLAM PO Take by mouth as needed.    [provider]  cephALEXin (KEFLEX) 500 MG capsule Take 1 capsule (500 mg  total) by mouth 2 (two) times daily for 7 days. Patient not taking: Reported on 05/03/2020 04/26/20 05/03/20  Kem Boroughs B, FNP  Oxymetazoline HCl (VICKS SINEX NA) Place into the nose as needed. Patient not taking: Reported on 05/03/2020    [provider]  pioglitazone (ACTOS) 15 MG tablet Take 15 mg by mouth daily.    [provider]    Allergies as of 04/26/2020 - Review Complete 04/26/2020  Allergen Reaction Noted  . Metformin and related Diarrhea 12/17/2014  . Penicillins Rash 12/17/2014    Family History  Problem Relation Age of Onset  . Cancer Sister        uterine  . Breast cancer Neg Hx     Social History   Socioeconomic History  . Marital status: Married    Spouse name: Not on file  . Number of children: Not on file  . Years of education: Not on file  . Highest education level: Not on file  Occupational History  . Not on file  Tobacco Use  . Smoking status: Never Smoker  . Smokeless tobacco: Never Used  Vaping Use  . Vaping Use: Never used  Substance and Sexual Activity  . Alcohol use: No  . Drug use: No  . Sexual activity: Not on file  Other Topics Concern  . Not on file  Social History Narrative  . Not on file   Social Determinants of Health   Financial Resource Strain: Not on file  Food Insecurity: Not on  file  Transportation Needs: Not on file  Physical Activity: Not on file  Stress: Not on file  Social Connections: Not on file  Intimate Partner Violence: Not on file    Review of Systems: See HPI, otherwise negative ROS  Physical Exam: BP (!) 156/71   Pulse 78   Temp 97.7 F (36.5 C) (Temporal)   Ht 5' 3.5" (1.613 m)   Wt 98 kg   SpO2 98%   BMI 37.66 kg/m  General:   Alert,  pleasant and cooperative in NAD Head:  Normocephalic and atraumatic. Respiratory:  Normal work of breathing.  Impression/Plan: Brenda Mullen is here for cataract surgery.  Risks, benefits, limitations, and alternatives regarding cataract  surgery have been reviewed with the patient.  Questions have been answered.  All parties agreeable.   Willey Blade, MD  05/03/2020, 8:00 AM

## 2020-05-03 NOTE — Op Note (Signed)
OPERATIVE NOTE  Brenda Mullen [id number] 05/03/2020   PREOPERATIVE DIAGNOSIS:  Nuclear sclerotic cataract right eye.  H25.11   POSTOPERATIVE DIAGNOSIS:    Nuclear sclerotic cataract right eye.     PROCEDURE:  Phacoemusification with posterior chamber intraocular lens placement of the right eye   LENS:   Implant Name Type Inv. Item Serial No. Manufacturer Lot No. LRB No. Used Action  LENS IOL TECNIS EYHANCE 12.0 - [id number] Intraocular Lens LENS IOL TECNIS EYHANCE 12.0 [id number] JOHNSON   Right 1 Implanted       Procedure(s) with comments: CATARACT EXTRACTION PHACO AND INTRAOCULAR LENS PLACEMENT (IOC) RIGHT DIABETIC (Right) - 1.18 0:17.3  DIB00 +12.0   ULTRASOUND TIME: 0 minutes 17 seconds.  CDE 1.18   SURGEON:  Willey Blade, MD, MPH  ANESTHESIOLOGIST: Anesthesiologist: Fletcher Anon, MD CRNA: Jinny Blossom, CRNA   ANESTHESIA:  Topical with tetracaine drops augmented with 1% preservative-free intracameral lidocaine.  ESTIMATED BLOOD LOSS: less than 1 mL.   COMPLICATIONS:  None.   DESCRIPTION OF PROCEDURE:  The patient was identified in the holding room and transported to the operating room and placed in the supine position under the operating microscope.  The right eye was identified as the operative eye and it was prepped and draped in the usual sterile ophthalmic fashion.   A 1.0 millimeter clear-corneal paracentesis was made at the 10:30 position. 0.5 ml of preservative-free 1% lidocaine with epinephrine was injected into the anterior chamber.  The anterior chamber was filled with Healon 5 viscoelastic.  A 2.4 millimeter keratome was used to make a near-clear corneal incision at the 8:00 position.  A curvilinear capsulorrhexis was made with a cystotome and capsulorrhexis forceps.  Balanced salt solution was used to hydrodissect and hydrodelineate the nucleus.   Phacoemulsification was then used in stop and chop fashion to remove the lens nucleus and epinucleus.  The  remaining cortex was then removed using the irrigation and aspiration handpiece. Healon was then placed into the capsular bag to distend it for lens placement.  A lens was then injected into the capsular bag.  The remaining viscoelastic was aspirated.   Wounds were hydrated with balanced salt solution.  The anterior chamber was inflated to a physiologic pressure with balanced salt solution.   Intracameral vigamox 0.1 mL undiluted was injected into the eye and a drop placed onto the ocular surface.  No wound leaks were noted.  The patient was taken to the recovery room in stable condition without complications of anesthesia or surgery  Willey Blade 05/03/2020, 8:37 AM

## 2020-05-03 NOTE — Anesthesia Procedure Notes (Signed)
Procedure Name: MAC Date/Time: 05/03/2020 8:15 AM Performed by: Jeannene Patella, CRNA Pre-anesthesia Checklist: Patient identified, Emergency Drugs available, Suction available, Timeout performed and Patient being monitored Patient Re-evaluated:Patient Re-evaluated prior to induction Oxygen Delivery Method: Nasal cannula Placement Confirmation: positive ETCO2

## 2020-05-03 NOTE — Anesthesia Postprocedure Evaluation (Signed)
Anesthesia Post Note  Patient: Brenda Mullen  Procedure(s) Performed: CATARACT EXTRACTION PHACO AND INTRAOCULAR LENS PLACEMENT (IOC) RIGHT DIABETIC (Right Eye)     Patient location during evaluation: PACU Anesthesia Type: MAC Level of consciousness: awake and alert Pain management: pain level controlled Vital Signs Assessment: post-procedure vital signs reviewed and stable Respiratory status: nonlabored ventilation and spontaneous breathing Cardiovascular status: blood pressure returned to baseline Postop Assessment: no apparent nausea or vomiting Anesthetic complications: no   No complications documented.  Jayquon Theiler [employer]

## 2020-05-03 NOTE — Transfer of Care (Signed)
Immediate Anesthesia Transfer of Care Note  Patient: Brenda Mullen  Procedure(s) Performed: CATARACT EXTRACTION PHACO AND INTRAOCULAR LENS PLACEMENT (IOC) RIGHT DIABETIC (Right Eye)  Patient Location: PACU  Anesthesia Type: MAC  Level of Consciousness: awake, alert  and patient cooperative  Airway and Oxygen Therapy: Patient Spontanous Breathing and Patient connected to supplemental oxygen  Post-op Assessment: Post-op Vital signs reviewed, Patient's Cardiovascular Status Stable, Respiratory Function Stable, Patent Airway and No signs of Nausea or vomiting  Post-op Vital Signs: Reviewed and stable  Complications: No complications documented.

## 2020-05-04 ENCOUNTER — Encounter: Payer: Self-pay | Admitting: Ophthalmology

## 2020-08-03 ENCOUNTER — Encounter: Payer: Self-pay | Admitting: Internal Medicine

## 2020-08-04 ENCOUNTER — Ambulatory Visit
Admission: RE | Admit: 2020-08-04 | Discharge: 2020-08-04 | Disposition: A | Discharge status: Home / Self Care | Payer: Medicare Other | Attending: Internal Medicine | Admitting: Internal Medicine

## 2020-08-04 ENCOUNTER — Encounter
Admission: RE | Disposition: A | Discharge status: Home / Self Care | Payer: Self-pay | Source: Home / Self Care | Attending: Internal Medicine

## 2020-08-04 ENCOUNTER — Encounter: Payer: Self-pay | Admitting: Internal Medicine

## 2020-08-04 ENCOUNTER — Ambulatory Visit: Payer: Medicare Other | Admitting: Anesthesiology

## 2020-08-04 DIAGNOSIS — Z794 Long term (current) use of insulin: Secondary | ICD-10-CM | POA: Insufficient documentation

## 2020-08-04 DIAGNOSIS — K224 Dyskinesia of esophagus: Secondary | ICD-10-CM | POA: Diagnosis not present

## 2020-08-04 DIAGNOSIS — K5730 Diverticulosis of large intestine without perforation or abscess without bleeding: Secondary | ICD-10-CM | POA: Diagnosis not present

## 2020-08-04 DIAGNOSIS — Z888 Allergy status to other drugs, medicaments and biological substances status: Secondary | ICD-10-CM | POA: Diagnosis not present

## 2020-08-04 DIAGNOSIS — Z79899 Other long term (current) drug therapy: Secondary | ICD-10-CM | POA: Diagnosis not present

## 2020-08-04 DIAGNOSIS — K2970 Gastritis, unspecified, without bleeding: Secondary | ICD-10-CM | POA: Diagnosis not present

## 2020-08-04 DIAGNOSIS — Z7984 Long term (current) use of oral hypoglycemic drugs: Secondary | ICD-10-CM | POA: Diagnosis not present

## 2020-08-04 DIAGNOSIS — Z91040 Latex allergy status: Secondary | ICD-10-CM | POA: Diagnosis not present

## 2020-08-04 DIAGNOSIS — Z8371 Family history of colonic polyps: Secondary | ICD-10-CM | POA: Diagnosis not present

## 2020-08-04 DIAGNOSIS — K2289 Other specified disease of esophagus: Secondary | ICD-10-CM | POA: Diagnosis not present

## 2020-08-04 DIAGNOSIS — R1314 Dysphagia, pharyngoesophageal phase: Secondary | ICD-10-CM | POA: Insufficient documentation

## 2020-08-04 DIAGNOSIS — K449 Diaphragmatic hernia without obstruction or gangrene: Secondary | ICD-10-CM | POA: Insufficient documentation

## 2020-08-04 DIAGNOSIS — Z880 Allergy status to penicillin: Secondary | ICD-10-CM | POA: Insufficient documentation

## 2020-08-04 DIAGNOSIS — K591 Functional diarrhea: Secondary | ICD-10-CM | POA: Diagnosis not present

## 2020-08-04 DIAGNOSIS — K640 First degree hemorrhoids: Secondary | ICD-10-CM | POA: Diagnosis not present

## 2020-08-04 HISTORY — DX: Plantar fascial fibromatosis: M72.2

## 2020-08-04 HISTORY — DX: Nail dystrophy: L60.3

## 2020-08-04 HISTORY — PX: COLONOSCOPY WITH PROPOFOL: SHX5780

## 2020-08-04 HISTORY — PX: ESOPHAGOGASTRODUODENOSCOPY (EGD) WITH PROPOFOL: SHX5813

## 2020-08-04 HISTORY — DX: Unspecified osteoarthritis, unspecified site: M19.90

## 2020-08-04 HISTORY — DX: Endocarditis, valve unspecified: I38

## 2020-08-04 LAB — GLUCOSE, CAPILLARY: Glucose-Capillary: 130 mg/dL — ABNORMAL HIGH (ref 70–99)

## 2020-08-04 SURGERY — COLONOSCOPY WITH PROPOFOL
Anesthesia: General

## 2020-08-04 MED ORDER — PROPOFOL 500 MG/50ML IV EMUL
INTRAVENOUS | Status: DC | PRN
  Administered 2020-08-04: 145 ug/kg/min via INTRAVENOUS

## 2020-08-04 MED ORDER — PROPOFOL 10 MG/ML IV BOLUS
INTRAVENOUS | Status: DC | PRN
  Administered 2020-08-04: 60 mg via INTRAVENOUS
  Administered 2020-08-04 (×3): 20 mg via INTRAVENOUS

## 2020-08-04 MED ORDER — SODIUM CHLORIDE 0.9 % IV SOLN
INTRAVENOUS | Status: DC
  Administered 2020-08-04: 1000 mL via INTRAVENOUS

## 2020-08-04 MED ORDER — GLYCOPYRROLATE 0.2 MG/ML IJ SOLN
INTRAMUSCULAR | Status: DC | PRN
  Administered 2020-08-04: .2 mg via INTRAVENOUS

## 2020-08-04 MED ORDER — ESMOLOL HCL 100 MG/10ML IV SOLN
INTRAVENOUS | Status: DC | PRN
  Administered 2020-08-04: 30 mg via INTRAVENOUS

## 2020-08-04 MED ORDER — LIDOCAINE HCL (CARDIAC) PF 100 MG/5ML IV SOSY
PREFILLED_SYRINGE | INTRAVENOUS | Status: DC | PRN
  Administered 2020-08-04: 100 mg via INTRAVENOUS

## 2020-08-04 NOTE — Interval H&P Note (Signed)
History and Physical Interval Note:  08/04/2020 2:30 PM  Brenda Mullen  has presented today for surgery, with the diagnosis of CHRONIC DIARRHEA FAM HX POLYPS ESOPH DYSPHAGIA GERD.  The various methods of treatment have been discussed with the patient and family. After consideration of risks, benefits and other options for treatment, the patient has consented to  Procedure(s) with comments: COLONOSCOPY WITH PROPOFOL (N/A) - IDDM ESOPHAGOGASTRODUODENOSCOPY (EGD) WITH PROPOFOL (N/A) as a surgical intervention.  The patient's history has been reviewed, patient examined, no change in status, stable for surgery.  I have reviewed the patient's chart and labs.  Questions were answered to the patient's satisfaction.     [place], [place]

## 2020-08-04 NOTE — Op Note (Signed)
[facility] Gastroenterology Patient Name: Brenda Mullen Procedure Date: 08/04/2020 2:28 PM MRN: [id number] Account #: [id number] Date of Birth: [birth year] Admit Type: Outpatient Age: 70 Room: [facility] ENDO ROOM 2 Gender: Female Note Status: Finalized Procedure:             Upper GI endoscopy Indications:           Esophageal dysphagia, Suspected esophageal reflux Providers:             [name]. Tarica Harl MD, MD Medicines:             Propofol per Anesthesia Complications:         No immediate complications. Procedure:             Pre-Anesthesia Assessment:                        - The risks and benefits of the procedure and the                         sedation options and risks were discussed with the                         patient. All questions were answered and informed                         consent was obtained.                        - Patient identification and proposed procedure were                         verified prior to the procedure by the nurse. The                         procedure was verified in the procedure room.                        - ASA Grade Assessment: III - A patient with severe                         systemic disease.                        - After reviewing the risks and benefits, the patient                         was deemed in satisfactory condition to undergo the                         procedure.                        After obtaining informed consent, the endoscope was                         passed under direct vision. Throughout the procedure,                         the patient's blood pressure, pulse, and oxygen  saturations were monitored continuously. The Endoscope                         was introduced through the mouth, and advanced to the                         third part of duodenum. The patient tolerated the                         procedure well. The upper GI endoscopy was somewhat                          difficult due to narrowing. Successful completion of                         the procedure was aided by withdrawing and reinserting                         the scope. The patient tolerated the procedure well. Findings:      The lumen of the esophagus was moderately dilated.      No appreciable esophageal motility was noted. In addition, a hypertonic       lower esophageal sphincter was found. There was moderate resistance to       endoscope advancement into the stomach. The Z-line was regular. The       gastroesophageal junction and cardia were normal on retroflexed view.       The scope was withdrawn. Dilation was performed with a Maloney dilator       with moderate resistance at 54 Fr. Biopsies were obtained from the       proximal and distal esophagus with cold forceps for histology of       suspected eosinophilic esophagitis.      A 2 cm hiatal hernia was present.      Patchy mild inflammation characterized by congestion (edema) and       erosions was found in the gastric antrum.      The examined duodenum was normal.      The exam was otherwise without abnormality. Impression:            - Dilation in the entire esophagus.                        - Esophageal motility disorder.                        - 2 cm hiatal hernia.                        - Gastritis.                        - Normal examined duodenum.                        - The examination was otherwise normal.                        - No specimens collected. Recommendation:        - Await pathology results.                        -  Monitor results to esophageal dilation                        - Proceed with colonoscopy Procedure Code(s):     --- Professional ---                        [phone], Esophagogastroduodenoscopy, flexible,                         transoral; with biopsy, single or multiple                        43450, Dilation of esophagus, by unguided sound or                         bougie, single or  multiple passes Diagnosis Code(s):     --- Professional ---                        R13.14, Dysphagia, pharyngoesophageal phase                        K29.70, Gastritis, unspecified, without bleeding                        K44.9, Diaphragmatic hernia without obstruction or                         gangrene                        K22.4, Dyskinesia of esophagus                        K22.8, Other specified diseases of esophagus CPT copyright 2019 American Medical Association. All rights reserved. The codes documented in this report are preliminary and upon coder review may  be revised to meet current compliance requirements. [name] MD, MD 08/04/2020 2:53:22 PM This report has been signed electronically. Number of Addenda: 0 Note Initiated On: 08/04/2020 2:28 PM Estimated Blood Loss:  Estimated blood loss: none. Estimated blood loss: none.      [facility]

## 2020-08-04 NOTE — Anesthesia Procedure Notes (Signed)
Procedure Name: General with mask airway Performed by: Fletcher-Harrison, Joandry Slagter, CRNA Pre-anesthesia Checklist: Patient identified, Emergency Drugs available, Suction available and Patient being monitored Patient Re-evaluated:Patient Re-evaluated prior to induction Oxygen Delivery Method: Simple face mask Induction Type: IV induction Placement Confirmation: positive ETCO2 and CO2 detector Dental Injury: Teeth and Oropharynx as per pre-operative assessment        

## 2020-08-04 NOTE — Anesthesia Postprocedure Evaluation (Signed)
Anesthesia Post Note  Patient: TIRZA SENTENO  Procedure(s) Performed: COLONOSCOPY WITH PROPOFOL (N/A ) ESOPHAGOGASTRODUODENOSCOPY (EGD) WITH PROPOFOL (N/A )  Patient location during evaluation: Endoscopy Anesthesia Type: General Level of consciousness: awake and alert Pain management: pain level controlled Vital Signs Assessment: post-procedure vital signs reviewed and stable Respiratory status: spontaneous breathing, nonlabored ventilation, respiratory function stable and patient connected to nasal cannula oxygen Cardiovascular status: blood pressure returned to baseline and stable Postop Assessment: no apparent nausea or vomiting Anesthetic complications: no   No complications documented.   Last Vitals:  Vitals:   08/04/20 1509 08/04/20 1519  BP: 119/74 (!) 135/91  Pulse: 95 93  Resp: (!) 21 20  Temp: 36.8 C   SpO2:      Last Pain:  Vitals:   08/04/20 1529  TempSrc:   PainSc: 5                  Cleda Mccreedy Saba Neuman

## 2020-08-04 NOTE — Transfer of Care (Signed)
Immediate Anesthesia Transfer of Care Note  Patient: Brenda Mullen  Procedure(s) Performed: COLONOSCOPY WITH PROPOFOL (N/A ) ESOPHAGOGASTRODUODENOSCOPY (EGD) WITH PROPOFOL (N/A )  Patient Location: Endoscopy Unit  Anesthesia Type:General  Level of Consciousness: drowsy and patient cooperative  Airway & Oxygen Therapy: Patient Spontanous Breathing and Patient connected to face mask oxygen  Post-op Assessment: Report given to RN and Post -op Vital signs reviewed and stable  Post vital signs: Reviewed and stable  Last Vitals:  Vitals Value Taken Time  BP    Temp    Pulse 96 08/04/20 1510  Resp 27 08/04/20 1510  SpO2 99 % 08/04/20 1510  Vitals shown include unvalidated device data.  Last Pain:  Vitals:   08/04/20 1357  TempSrc: Temporal  PainSc: 0-No pain      Patients Stated Pain Goal: 0 (08/04/20 1357)  Complications: No complications documented.

## 2020-08-04 NOTE — Anesthesia Preprocedure Evaluation (Signed)
Anesthesia Evaluation  Patient identified by MRN, date of birth, ID band Patient awake    Reviewed: Allergy & Precautions, NPO status , Patient's Chart, lab work & pertinent test results  History of Anesthesia Complications Negative for: history of anesthetic complications  Airway Mallampati: II  TM Distance: >3 FB Neck ROM: Full    Dental  (+) Poor Dentition, Missing   Pulmonary neg pulmonary ROS, neg sleep apnea, neg COPD,    breath sounds clear to auscultation- rhonchi (-) wheezing      Cardiovascular hypertension, Pt. on medications (-) angina(-) CAD, (-) Past MI and (-) Cardiac Stents  Rhythm:Regular Rate:Normal - Systolic murmurs and - Diastolic murmurs    Neuro/Psych neg Seizures negative neurological ROS  negative psych ROS   GI/Hepatic negative GI ROS, Neg liver ROS,   Endo/Other  diabetes, Insulin Dependent  Renal/GU negative Renal ROS     Musculoskeletal  (+) Arthritis ,   Abdominal (+) + obese,   Peds  Hematology negative hematology ROS (+)   Anesthesia Other Findings Past Medical History: No date: Arthritis     Comment:  spine No date: Diabetes mellitus without complication (HCC) No date: Hypertension No date: 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 No date: Palpitations No date: Plantar fasciitis No date: Sciatica of right side No date: Wears dentures     Comment:  full lower   Reproductive/Obstetrics                             Anesthesia Physical Anesthesia Plan  ASA: III  Anesthesia Plan: General   Post-op Pain Management:    Induction: Intravenous  PONV Risk Score and Plan: 2 and Propofol infusion  Airway Management Planned: Natural Airway  Additional Equipment:   Intra-op Plan:   Post-operative Plan:   Informed Consent: I have reviewed the patients History and Physical, chart, labs and discussed the procedure including the risks, benefits and  alternatives for the proposed anesthesia with the patient or authorized representative who has indicated his/her understanding and acceptance.     Dental advisory given  Plan Discussed with: CRNA and Anesthesiologist  Anesthesia Plan Comments:         Anesthesia Quick Evaluation

## 2020-08-04 NOTE — H&P (Signed)
Outpatient short stay form Pre-procedure 08/04/2020 2:08 PM Brenda Mullen Brenda Mullen, M.D.  Primary Physician: [name], M.D.  Reason for visit:  Dysphagia, functional diarrhea  History of present illness:  Patient is a 70 y/o female with family history of colon polyps who has increased liquid consistency to stools with fecal urgency. No bleeding. Has esophageal dysphagia to the lower sternum with some need for occasional food regurgitation.     Current Facility-Administered Medications:  .  0.9 %  sodium chloride infusion, , Intravenous, Continuous, Brenda Mullen, Brenda Nearing, MD  Medications Prior to Admission  Medication Sig Dispense Refill Last Dose  . ALPRAZOLAM PO Take by mouth as needed.   Past Week at Unknown time  . amLODipine-benazepril (LOTREL) 5-10 MG capsule Take 1 capsule by mouth daily.   Past Week at Unknown time  . atenolol (TENORMIN) 25 MG tablet Take 25 mg by mouth daily.   Past Week at Unknown time  . diphenhydramine-acetaminophen (TYLENOL PM) 25-500 MG TABS tablet Take 1 tablet by mouth at bedtime as needed.   Past Week at Unknown time  . fluticasone (FLONASE) 50 MCG/ACT nasal spray Place 2 sprays into both nostrils daily.   Past Week at Unknown time  . glipiZIDE (GLUCOTROL) 5 MG tablet Take 5 mg by mouth daily before breakfast.   08/03/2020 at Unknown time  . glucose blood test strip 1 each by Other route as needed for other. Use as instructed   08/03/2020 at Unknown time  . hydrochlorothiazide (HYDRODIURIL) 12.5 MG tablet Take 12.5 mg by mouth daily.   Past Week at Unknown time  . insulin glargine (LANTUS) 100 UNIT/ML injection Inject 18 Units into the skin daily. AM   08/03/2020 at Unknown time  . pantoprazole (PROTONIX) 20 MG tablet Take 20 mg by mouth daily.   Past Week at Unknown time  . pioglitazone (ACTOS) 15 MG tablet Take 15 mg by mouth daily.   Past Week at Unknown time  . fluconazole (DIFLUCAN) 150 MG tablet Take 1 tablet (150 mg total) by mouth daily. (Patient not  taking: Reported on 08/04/2020) 1 tablet 0 Completed Course at Unknown time  . Oxymetazoline HCl (VICKS SINEX NA) Place into the nose as needed. (Patient not taking: Reported on 05/03/2020)        Allergies  Allergen Reactions  . Latex   . Metformin And Related Diarrhea  . Penicillins Rash     Past Medical History:  Diagnosis Date  . Arthritis    spine  . Diabetes mellitus without complication (HCC)   . Hypertension   . Leaky heart valve   . Nail dystrophy   . Palpitations   . Plantar fasciitis   . Sciatica of right side   . Wears dentures    full lower    Review of systems:  Otherwise negative.    Physical Exam  Gen: Alert, oriented. Appears stated age.  HEENT: [place]/AT. PERRLA. Lungs: CTA, no wheezes. CV: RR nl S1, S2. Abd: soft, benign, no masses. BS+ Ext: No edema. Pulses 2+    Planned procedures: Proceed with EGD and colonoscopy. The patient understands the nature of the planned procedure, indications, risks, alternatives and potential complications including but not limited to bleeding, infection, perforation, damage to internal organs and possible oversedation/side effects from anesthesia. The patient agrees and gives consent to proceed.  Please refer to procedure notes for findings, recommendations and patient disposition/instructions.     Brenda Mullen Brenda Mullen, M.D. Gastroenterology 08/04/2020  2:08 PM

## 2020-08-04 NOTE — Op Note (Addendum)
[facility] Gastroenterology Patient Name: Brenda Mullen Procedure Date: 08/04/2020 2:28 PM MRN: [id number] Account #: [id number] Date of Birth: 07/13/1950 Admit Type: Outpatient Age: 70 Room: [facility] ENDO ROOM 2 Gender: Female Note Status: Supervisor Override Procedure:             Colonoscopy Indications:           Functional diarrhea, Family history of colon polyps Providers:             [name]. Briton Sellman MD, MD Medicines:             Propofol per Anesthesia Complications:         No immediate complications. Procedure:             Pre-Anesthesia Assessment:                        - The risks and benefits of the procedure and the                         sedation options and risks were discussed with the                         patient. All questions were answered and informed                         consent was obtained.                        - Patient identification and proposed procedure were                         verified prior to the procedure by the nurse. The                         procedure was verified in the procedure room.                        - ASA Grade Assessment: III - A patient with severe                         systemic disease.                        - After reviewing the risks and benefits, the patient                         was deemed in satisfactory condition to undergo the                         procedure.                        After obtaining informed consent, the colonoscope was                         passed under direct vision. Throughout the procedure,                         the patient's blood pressure, pulse, and oxygen  saturations were monitored continuously. The                         Colonoscope was introduced through the anus and                         advanced to the the cecum, identified by appendiceal                         orifice and ileocecal valve. The colonoscopy was                          performed without difficulty. The quality of the bowel                         preparation was adequate. The ileocecal valve,                         appendiceal orifice, and rectum were photographed. Findings:      The perianal and digital rectal examinations were normal. Pertinent       negatives include normal sphincter tone and no palpable rectal lesions.      Non-bleeding internal hemorrhoids were found during retroflexion. The       hemorrhoids were Grade I (internal hemorrhoids that do not prolapse).      Multiple small and large-mouthed diverticula were found in the sigmoid       colon.      Normal mucosa was found in the entire colon. Biopsies for histology were       taken with a cold forceps from the random colon for evaluation of       microscopic colitis.      The exam was otherwise without abnormality. Impression:            - Non-bleeding internal hemorrhoids.                        - Diverticulosis in the sigmoid colon.                        - Normal mucosa in the entire examined colon. Biopsied.                        - The examination was otherwise normal. Recommendation:        - Patient has a contact number available for                         emergencies. The signs and symptoms of potential                         delayed complications were discussed with the patient.                         Return to normal activities tomorrow. Written                         discharge instructions were provided to the patient.                        - Resume previous diet.                        -  Continue present medications.                        - Await pathology results.                        - Repeat colonoscopy in 10 years for screening                         purposes.                        - Return to GI office in 3 months.                        - The findings and recommendations were discussed with                         the patient. Procedure Code(s):     ---  Professional ---                        [id number], Colonoscopy, flexible; with biopsy, single or                         multiple Diagnosis Code(s):     --- Professional ---                        K57.30, Diverticulosis of large intestine without                         perforation or abscess without bleeding                        K59.1, Functional diarrhea                        K64.0, First degree hemorrhoids CPT copyright 2019 American Medical Association. All rights reserved. The codes documented in this report are preliminary and upon coder review may  be revised to meet current compliance requirements. [name] MD, MD 08/04/2020 3:07:32 PM This report has been signed electronically. Number of Addenda: 0 Note Initiated On: 08/04/2020 2:28 PM Scope Withdrawal Time: 0 hours 4 minutes 57 seconds  Total Procedure Duration: 0 hours 8 minutes 18 seconds  Estimated Blood Loss:  Estimated blood loss: none.      [facility]

## 2020-08-05 ENCOUNTER — Encounter: Payer: Self-pay | Admitting: Internal Medicine

## 2020-08-06 LAB — SURGICAL PATHOLOGY

## 2021-06-17 ENCOUNTER — Other Ambulatory Visit: Payer: Self-pay | Admitting: Internal Medicine

## 2021-06-17 DIAGNOSIS — Z1231 Encounter for screening mammogram for malignant neoplasm of breast: Secondary | ICD-10-CM

## 2021-07-25 ENCOUNTER — Ambulatory Visit
Admission: RE | Admit: 2021-07-25 | Discharge: 2021-07-25 | Disposition: A | Discharge status: Home / Self Care | Payer: Medicare Other | Source: Ambulatory Visit | Attending: Internal Medicine | Admitting: Internal Medicine

## 2021-07-25 DIAGNOSIS — Z1231 Encounter for screening mammogram for malignant neoplasm of breast: Secondary | ICD-10-CM | POA: Insufficient documentation

## 2023-07-17 ENCOUNTER — Ambulatory Visit
Admission: RE | Admit: 2023-07-17 | Discharge: 2023-07-17 | Disposition: A | Discharge status: Home / Self Care | Source: Ambulatory Visit | Attending: Physician Assistant | Admitting: Physician Assistant

## 2023-07-17 ENCOUNTER — Other Ambulatory Visit: Payer: Self-pay | Admitting: Physician Assistant

## 2023-07-17 DIAGNOSIS — M7989 Other specified soft tissue disorders: Secondary | ICD-10-CM

## 2023-10-18 ENCOUNTER — Other Ambulatory Visit: Payer: Self-pay | Admitting: Student

## 2023-10-18 DIAGNOSIS — M47816 Spondylosis without myelopathy or radiculopathy, lumbar region: Secondary | ICD-10-CM

## 2023-10-18 DIAGNOSIS — M5416 Radiculopathy, lumbar region: Secondary | ICD-10-CM

## 2023-10-21 ENCOUNTER — Ambulatory Visit
Admission: RE | Admit: 2023-10-21 | Discharge: 2023-10-21 | Disposition: A | Discharge status: Home / Self Care | Source: Ambulatory Visit | Attending: Student

## 2023-10-21 DIAGNOSIS — M47816 Spondylosis without myelopathy or radiculopathy, lumbar region: Secondary | ICD-10-CM | POA: Diagnosis present

## 2023-10-21 DIAGNOSIS — M5416 Radiculopathy, lumbar region: Secondary | ICD-10-CM | POA: Diagnosis present
